# Patient Record
Sex: Female | Born: 1950 | Race: Black or African American | Hispanic: No | Marital: Married | Smoking: Former smoker
Health system: Southern US, Community
[De-identification: ages and names within clinical notes are randomized; demographics above are authoritative.]

## PROBLEM LIST (undated history)

## (undated) DIAGNOSIS — G4733 Obstructive sleep apnea (adult) (pediatric): Secondary | ICD-10-CM

## (undated) DIAGNOSIS — Z860100 Personal history of colon polyps, unspecified: Secondary | ICD-10-CM

## (undated) DIAGNOSIS — I714 Abdominal aortic aneurysm, without rupture, unspecified: Secondary | ICD-10-CM

## (undated) DIAGNOSIS — I10 Essential (primary) hypertension: Secondary | ICD-10-CM

## (undated) DIAGNOSIS — E785 Hyperlipidemia, unspecified: Secondary | ICD-10-CM

## (undated) DIAGNOSIS — R3129 Other microscopic hematuria: Secondary | ICD-10-CM

## (undated) DIAGNOSIS — K219 Gastro-esophageal reflux disease without esophagitis: Secondary | ICD-10-CM

## (undated) DIAGNOSIS — L732 Hidradenitis suppurativa: Secondary | ICD-10-CM

## (undated) HISTORY — DX: Personal history of colon polyps, unspecified: Z86.0100

## (undated) HISTORY — DX: Obstructive sleep apnea (adult) (pediatric): G47.33

## (undated) HISTORY — DX: Hyperlipidemia, unspecified: E78.5

## (undated) HISTORY — DX: Gastro-esophageal reflux disease without esophagitis: K21.9

## (undated) HISTORY — DX: Essential (primary) hypertension: I10

## (undated) HISTORY — DX: Hidradenitis suppurativa: L73.2

## (undated) HISTORY — DX: Personal history of colonic polyps: Z86.010

## (undated) HISTORY — DX: Other microscopic hematuria: R31.29

---

## 1975-04-10 HISTORY — PX: TUBAL LIGATION: SHX77

## 2000-05-22 ENCOUNTER — Other Ambulatory Visit: Admission: RE | Admit: 2000-05-22 | Discharge: 2000-05-22 | Payer: Self-pay | Admitting: Endocrinology

## 2000-07-03 ENCOUNTER — Encounter: Admission: RE | Admit: 2000-07-03 | Discharge: 2000-07-03 | Payer: Self-pay | Admitting: Endocrinology

## 2000-07-03 ENCOUNTER — Encounter: Payer: Self-pay | Admitting: Endocrinology

## 2000-07-10 ENCOUNTER — Encounter: Payer: Self-pay | Admitting: Endocrinology

## 2000-07-10 ENCOUNTER — Encounter: Admission: RE | Admit: 2000-07-10 | Discharge: 2000-07-10 | Payer: Self-pay | Admitting: Endocrinology

## 2000-07-31 ENCOUNTER — Ambulatory Visit (HOSPITAL_COMMUNITY): Admission: RE | Admit: 2000-07-31 | Discharge: 2000-07-31 | Payer: Self-pay | Admitting: *Deleted

## 2001-03-17 ENCOUNTER — Ambulatory Visit (HOSPITAL_BASED_OUTPATIENT_CLINIC_OR_DEPARTMENT_OTHER): Admission: RE | Admit: 2001-03-17 | Discharge: 2001-03-17 | Payer: Self-pay | Admitting: Specialist

## 2001-03-31 ENCOUNTER — Ambulatory Visit (HOSPITAL_BASED_OUTPATIENT_CLINIC_OR_DEPARTMENT_OTHER): Admission: RE | Admit: 2001-03-31 | Discharge: 2001-03-31 | Payer: Self-pay | Admitting: Specialist

## 2001-10-03 ENCOUNTER — Encounter: Admission: RE | Admit: 2001-10-03 | Discharge: 2001-10-03 | Payer: Self-pay | Admitting: Endocrinology

## 2001-10-03 ENCOUNTER — Encounter: Payer: Self-pay | Admitting: Endocrinology

## 2001-10-06 ENCOUNTER — Other Ambulatory Visit: Admission: RE | Admit: 2001-10-06 | Discharge: 2001-10-06 | Payer: Self-pay | Admitting: Endocrinology

## 2002-06-19 ENCOUNTER — Ambulatory Visit (HOSPITAL_COMMUNITY): Admission: RE | Admit: 2002-06-19 | Discharge: 2002-06-19 | Payer: Self-pay | Admitting: *Deleted

## 2002-09-01 ENCOUNTER — Encounter: Payer: Self-pay | Admitting: Endocrinology

## 2002-09-01 ENCOUNTER — Encounter: Admission: RE | Admit: 2002-09-01 | Discharge: 2002-09-01 | Payer: Self-pay | Admitting: Endocrinology

## 2002-10-05 ENCOUNTER — Encounter: Payer: Self-pay | Admitting: Endocrinology

## 2002-10-05 ENCOUNTER — Encounter: Admission: RE | Admit: 2002-10-05 | Discharge: 2002-10-05 | Payer: Self-pay | Admitting: Endocrinology

## 2002-10-09 ENCOUNTER — Other Ambulatory Visit: Admission: RE | Admit: 2002-10-09 | Discharge: 2002-10-09 | Payer: Self-pay | Admitting: Endocrinology

## 2003-12-01 ENCOUNTER — Encounter: Admission: RE | Admit: 2003-12-01 | Discharge: 2003-12-01 | Payer: Self-pay | Admitting: Family Medicine

## 2011-12-21 ENCOUNTER — Other Ambulatory Visit: Payer: Self-pay | Admitting: Family

## 2011-12-21 ENCOUNTER — Telehealth: Payer: Self-pay | Admitting: *Deleted

## 2011-12-21 ENCOUNTER — Encounter: Payer: Self-pay | Admitting: Family

## 2011-12-21 ENCOUNTER — Ambulatory Visit (INDEPENDENT_AMBULATORY_CARE_PROVIDER_SITE_OTHER): Payer: BC Managed Care – PPO | Admitting: Family

## 2011-12-21 VITALS — BP 116/82 | HR 82 | Temp 98.2°F | Resp 16 | Ht 64.25 in | Wt 198.0 lb

## 2011-12-21 DIAGNOSIS — I10 Essential (primary) hypertension: Secondary | ICD-10-CM | POA: Insufficient documentation

## 2011-12-21 DIAGNOSIS — K219 Gastro-esophageal reflux disease without esophagitis: Secondary | ICD-10-CM

## 2011-12-21 DIAGNOSIS — E785 Hyperlipidemia, unspecified: Secondary | ICD-10-CM

## 2011-12-21 DIAGNOSIS — Z86010 Personal history of colonic polyps: Secondary | ICD-10-CM

## 2011-12-21 DIAGNOSIS — Z1231 Encounter for screening mammogram for malignant neoplasm of breast: Secondary | ICD-10-CM

## 2011-12-21 DIAGNOSIS — Z23 Encounter for immunization: Secondary | ICD-10-CM

## 2011-12-21 DIAGNOSIS — I714 Abdominal aortic aneurysm, without rupture: Secondary | ICD-10-CM | POA: Insufficient documentation

## 2011-12-21 DIAGNOSIS — L732 Hidradenitis suppurativa: Secondary | ICD-10-CM

## 2011-12-21 HISTORY — DX: Hidradenitis suppurativa: L73.2

## 2011-12-21 LAB — BASIC METABOLIC PANEL
BUN: 8 mg/dL (ref 6–23)
Chloride: 103 mEq/L (ref 96–112)
Glucose, Bld: 90 mg/dL (ref 70–99)
Potassium: 4.5 mEq/L (ref 3.5–5.3)
Sodium: 137 mEq/L (ref 135–145)

## 2011-12-21 MED ORDER — SUCRALFATE 1 G PO TABS: 1.0000 g | ORAL_TABLET | Freq: Four times a day (QID) | ORAL | Status: DC

## 2011-12-21 MED ORDER — ATORVASTATIN CALCIUM 80 MG PO TABS: 80.0000 mg | ORAL_TABLET | Freq: Every day | ORAL | Status: AC

## 2011-12-21 MED ORDER — AMLODIPINE-VALSARTAN-HCTZ 5-160-12.5 MG PO TABS: 1.0000 | ORAL_TABLET | Freq: Every day | ORAL | Status: DC

## 2011-12-21 MED ORDER — DEXLANSOPRAZOLE 60 MG PO CPDR: 60.0000 mg | DELAYED_RELEASE_CAPSULE | Freq: Every day | ORAL | Status: DC

## 2011-12-21 NOTE — Telephone Encounter (Signed)
Received fax from [facility] for dexilant. Prior auth form received and forwarded to Provider for completion and signature.

## 2011-12-21 NOTE — Assessment & Plan Note (Signed)
Advised pt to apply warm compresses to the areas as they appear.  Schedule OV if areas become sore/swollen so that we can start abx.

## 2011-12-21 NOTE — Patient Instructions (Addendum)
Please complete your blood work prior to leaving.  Follow up at your convenience for a fasting physical. Welcome to [facility]!

## 2011-12-21 NOTE — Assessment & Plan Note (Signed)
Pt reports that she has been following regularly at [facility] for monitoring.  She reports that she has upcoming apt scheduled. She does not remember name of MD at [facility], will request PCP records from [facility].

## 2011-12-21 NOTE — Assessment & Plan Note (Signed)
Stable. Continue dexilant/carafate.

## 2011-12-21 NOTE — Assessment & Plan Note (Signed)
Reports last colo 1 year ago with Dr. Noe Gens.  Management per GI.  Records requested.

## 2011-12-21 NOTE — Assessment & Plan Note (Signed)
Well controlled on exforge. Continue same.  Obtain BMET.

## 2011-12-21 NOTE — Progress Notes (Signed)
Subjective:    Patient ID: Briana Hernandez, female    DOB: 07-12-1950, 61 y.o.   MRN: [id number]  HPI  Has been followed at [facility] for primary care.  New today to establish care.   Skin infections- reports that she has hx of "boils in my armpits" for years and reports that she has had a surgical excision for this.  Reports that she also gets these boils in the groin area.  No boils today.  HTN-on exforge hct. Requesting refills and a coupon.  Hyperlipidemia- on lipitor. She denies myalgias.  GERD- on carafate and dexilant. Reports that she does not take every day.  Does help when she takes it.  Colon Polyps- Dr. Noe Gens at [facility].  Reports last colo was 1 year ago.   AAA- follows at [facility]- she has follow up ultrasound scheduled.     Review of Systems  Constitutional: Negative for unexpected weight change.  HENT: Negative for hearing loss.   Eyes: Negative for visual disturbance.  Respiratory: Negative for cough and shortness of breath.   Cardiovascular: Negative for chest pain.  Gastrointestinal: Positive for constipation. Negative for diarrhea.       Tries to drink coffee to help her have BM.  She has also tried fiber pills.  Reports 5 BM's a week.  Musculoskeletal: Negative for myalgias and arthralgias.  Skin: Negative for rash.  Neurological: Negative for headaches.  Hematological: Does not bruise/bleed easily.  Psychiatric/Behavioral:       Denies depression/anxiety   Past Medical History  Diagnosis Date  . GERD (gastroesophageal reflux disease)   . Hyperlipidemia   . Hypertension   . History of colon polyps 04/10/11?    [facility]--Dr Noe Gens    History   Social History  . Marital Status: Married    Spouse Name: N/A    Number of Children: 4  . Years of Education: N/A   Occupational History  . Not on file.   Social History Main Topics  . Smoking status: Current Every Day Smoker -- 1.0 packs/day for 35 years    Types: Cigarettes    . Smokeless tobacco: Never Used  . Alcohol Use: 7.2 oz/week    12 Cans of beer per week  . Drug Use: Not on file  . Sexually Active: Not on file   Other Topics Concern  . 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 Tubal ligation 1977    Family History  Problem Relation Age of Onset  . Diabetes Son     type I  . Leukemia Maternal Aunt   . Heart disease Maternal Uncle   . Hypertension Maternal Uncle   . Diabetes Maternal Uncle   . Stroke Maternal Grandmother   . Cancer Cousin     lung?    No Known Allergies  Current Outpatient Prescriptions on File Prior to Visit  Medication Sig Dispense Refill  . Amlodipine-Valsartan-HCTZ (EXFORGE HCT) 5-160-12.5 MG TABS Take 1 tablet by mouth daily.  30 tablet  5  . atorvastatin (LIPITOR) 80 MG tablet Take 1 tablet (80 mg total) by mouth daily.  30 tablet  5  . dexlansoprazole (DEXILANT) 60 MG capsule Take 1 capsule (60 mg total) by mouth daily.  30 capsule  5  . sucralfate (CARAFATE) 1 G tablet Take 1 tablet (1 g  total) by mouth 4 (four) times daily.  120 tablet  5    BP 116/82  Pulse 82  Temp 98.2 F (36.8 C) (Oral)  Resp 16  Ht 5' 4.25" (1.632 m)  Wt 198 lb (89.812 kg)  BMI 33.72 kg/m2  SpO2 97%       Objective:   Physical Exam  Constitutional: She is oriented to person, place, and time. She appears well-developed and well-nourished. No distress.  HENT:  Head: Normocephalic and atraumatic.  Right Ear: Tympanic membrane and ear canal normal.  Left Ear: Tympanic membrane and ear canal normal.  Mouth/Throat: No posterior oropharyngeal edema or posterior oropharyngeal erythema.  Cardiovascular: Normal rate and regular rhythm.   No murmur heard. Pulmonary/Chest: Effort normal and breath sounds normal. No respiratory distress. She has no  wheezes. She has no rales. She exhibits no tenderness.  Abdominal: Soft. Bowel sounds are normal.  Musculoskeletal: She exhibits no edema.  Neurological: She is alert and oriented to person, place, and time.  Skin: Skin is warm and dry.  Psychiatric: She has a normal mood and affect. Her behavior is normal. Judgment and thought content normal.          Assessment & Plan:

## 2011-12-21 NOTE — Assessment & Plan Note (Signed)
Tolerating statin.  Plan check LFT next visit.  Consider adding baby aspirin at that time for cardiac protection.

## 2011-12-24 ENCOUNTER — Encounter: Payer: Self-pay | Admitting: Family

## 2011-12-24 ENCOUNTER — Telehealth: Payer: Self-pay | Admitting: Family

## 2011-12-24 NOTE — Telephone Encounter (Signed)
Called pt re: prior auth for dexilant. She tells me that she has tried nexium and omeprazole and they did not help her symptoms.  Told her that I would leave samples of dexilant (#20 tabs) at the front desk for her.

## 2011-12-24 NOTE — Telephone Encounter (Signed)
Signed.

## 2011-12-25 NOTE — Telephone Encounter (Signed)
PA form was faxed to [facility] on 12/24/11. Received authorization today for Dexilant from 12/03/11 through 12/23/12. [facility] and left detailed message on pt's home #.

## 2012-01-04 ENCOUNTER — Ambulatory Visit (HOSPITAL_BASED_OUTPATIENT_CLINIC_OR_DEPARTMENT_OTHER)
Admission: RE | Admit: 2012-01-04 | Discharge: 2012-01-04 | Disposition: A | Discharge status: Home / Self Care | Payer: BC Managed Care – PPO | Source: Ambulatory Visit | Attending: Family | Admitting: Family

## 2012-01-04 ENCOUNTER — Encounter: Payer: Self-pay | Admitting: Family

## 2012-01-04 ENCOUNTER — Other Ambulatory Visit (HOSPITAL_COMMUNITY)
Admission: RE | Admit: 2012-01-04 | Discharge: 2012-01-04 | Disposition: A | Discharge status: Home / Self Care | Payer: BC Managed Care – PPO | Source: Ambulatory Visit | Attending: Family | Admitting: Family

## 2012-01-04 ENCOUNTER — Ambulatory Visit (INDEPENDENT_AMBULATORY_CARE_PROVIDER_SITE_OTHER): Payer: BC Managed Care – PPO | Admitting: Family

## 2012-01-04 VITALS — BP 98/72 | HR 76 | Temp 99.5°F | Resp 16 | Ht 63.0 in | Wt 197.5 lb

## 2012-01-04 DIAGNOSIS — Z01419 Encounter for gynecological examination (general) (routine) without abnormal findings: Secondary | ICD-10-CM | POA: Insufficient documentation

## 2012-01-04 DIAGNOSIS — R413 Other amnesia: Secondary | ICD-10-CM

## 2012-01-04 DIAGNOSIS — I10 Essential (primary) hypertension: Secondary | ICD-10-CM

## 2012-01-04 DIAGNOSIS — Z23 Encounter for immunization: Secondary | ICD-10-CM

## 2012-01-04 DIAGNOSIS — Z1231 Encounter for screening mammogram for malignant neoplasm of breast: Secondary | ICD-10-CM

## 2012-01-04 DIAGNOSIS — R21 Rash and other nonspecific skin eruption: Secondary | ICD-10-CM

## 2012-01-04 DIAGNOSIS — Z0000 Encounter for general adult medical examination without abnormal findings: Secondary | ICD-10-CM

## 2012-01-04 DIAGNOSIS — I714 Abdominal aortic aneurysm, without rupture: Secondary | ICD-10-CM

## 2012-01-04 MED ORDER — BETAMETHASONE DIPROPIONATE 0.05 % EX CREA: TOPICAL_CREAM | Freq: Two times a day (BID) | CUTANEOUS | Status: DC

## 2012-01-04 NOTE — Patient Instructions (Addendum)
Please complete your blood work prior to leaving.  Call your insurance and see if they will cover shingles vaccine (zostavax). If so, let us know and we will check to see if you are immune to chicken pox first. Schedule a follow up appointment for memory testing (30 minute appointment).

## 2012-01-04 NOTE — Progress Notes (Signed)
Subjective:    Patient ID: AZURI YA, female    DOB: June 09, 1950, 61 y.o.   MRN: [id number]  HPI  Patient presents today for complete physical.  Immunizations: Flu shot today, last tetanus- unknown.   Diet: healthy Exercise: Colonoscopy: last year Dexa:? Pap Smear: Needs pap smear.   Mammogram: had this morning.  Tobacco abuse-    Rash left forearm- Sunday x 2 weeks.    She reports memory problems lately.  Sister has dementia.   Reports some problems sleeping.     Review of Systems  Constitutional: Negative for unexpected weight change.  HENT: Negative for hearing loss.   Eyes: Negative for visual disturbance.  Respiratory: Negative for cough.   Cardiovascular: Negative for leg swelling.  Gastrointestinal: Negative for constipation.       Reports "gassey."    Genitourinary: Positive for frequency. Negative for dysuria.  Musculoskeletal: Negative for myalgias.       L wrist pain.  Bothering her x 1 week, no injury  Skin: Positive for rash.  Neurological: Negative for headaches.  Hematological: Does not bruise/bleed easily.  Psychiatric/Behavioral:       Denies depression or anxiety   Past Medical History  Diagnosis Date  . GERD (gastroesophageal reflux disease)   . Hyperlipidemia   . Hypertension   . History of colon polyps 04/10/11?    [facility]--Dr Noe Gens  . Hidradenitis suppurativa 12/21/2011    History   Social History  . Marital Status: Married    Spouse Name: N/A    Number of Children: 4  . Years of Education: N/A   Occupational History  . Not on file.   Social History Main Topics  . Smoking status: Current Every Day Smoker -- 1.0 packs/day for 35 years    Types: Cigarettes  . Smokeless tobacco: Never Used  . Alcohol Use: 7.2 oz/week    12 Cans of beer per week  . Drug Use: Not on file  . Sexually Active: Not on file   Other Topics Concern  . 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 Tubal ligation 1977    Family History  Problem Relation Age of Onset  . Diabetes Son     type I  . Leukemia Maternal Aunt   . Heart disease Maternal Uncle   . Hypertension Maternal Uncle   . Diabetes Maternal Uncle   . Stroke Maternal Grandmother   . Cancer Cousin     lung?    No Known Allergies  Current Outpatient Prescriptions on File Prior to Visit  Medication Sig Dispense Refill  . Amlodipine-Valsartan-HCTZ (EXFORGE HCT) 5-160-12.5 MG TABS Take 1 tablet by mouth daily.  30 tablet  5  . atorvastatin (LIPITOR) 80 MG tablet Take 1 tablet (80 mg total) by mouth daily.  30 tablet  5  . dexlansoprazole (DEXILANT) 60 MG capsule Take 1 capsule (60 mg total) by mouth daily.  30 capsule  5  . sucralfate (CARAFATE) 1 G tablet Take 1 tablet (1 g total) by mouth 4 (four) times daily.  120 tablet  5    BP 98/72  Pulse 76  Temp 99.5 F (37.5 C) (Oral)  Resp 16  Ht 5\' 3"  (1.6 m)  Wt 197 lb 8 oz (89.585 kg)  BMI 34.99 kg/m2  SpO2 97%  Objective:   Physical Exam   Physical Exam  Constitutional: She is oriented to person, place, and time. She appears well-developed and well-nourished. No distress.  HENT:  Head: Normocephalic and atraumatic.  Right Ear: Tympanic membrane and ear canal normal.  Left Ear: Tympanic membrane and ear canal normal.  Mouth/Throat: Oropharynx is clear and moist.  Eyes: Pupils are equal, round, and reactive to light. No scleral icterus.  Neck: Normal range of motion. No thyromegaly present.  Cardiovascular: Normal rate and regular rhythm.   No murmur heard. Pulmonary/Chest: Effort normal and breath sounds normal. No respiratory distress. He has no wheezes. She has no rales. She exhibits no tenderness.  Abdominal: Soft. Bowel sounds are normal. He exhibits no distension and no mass. There is  no tenderness. There is no rebound and no guarding.  Musculoskeletal: She exhibits no edema.  Lymphadenopathy:    She has no cervical adenopathy.  Neurological: She is alert and oriented to person, place, and time.  She exhibits normal muscle tone. Coordination normal.  Skin: Skin is warm and dry. raised erythematous rash noted left forearm.  Psychiatric: She has a normal mood and affect. Her behavior is normal. Judgment and thought content normal.  Breasts: Examined lying Right: Without masses, retractions, discharge or axillary adenopathy.  Left: Without masses, retractions, discharge or axillary adenopathy.  Inguinal/mons: Normal without inguinal adenopathy  External genitalia: Normal  BUS/Urethra/Skene's glands: Normal  Bladder: Normal  Vagina: Normal  Cervix: Normal (Pap performed with chaperone) Uterus: normal in size, shape and contour. Midline and mobile  Adnexa/parametria:  Rt: Without masses or tenderness.  Lt: Without masses or tenderness.  Anus and perineum: Normal           Assessment & Plan:        Assessment & Plan:

## 2012-01-06 DIAGNOSIS — R21 Rash and other nonspecific skin eruption: Secondary | ICD-10-CM | POA: Insufficient documentation

## 2012-01-06 NOTE — Assessment & Plan Note (Addendum)
She is following at [facility] for monitoring- has an apt next week.  She will request that consultant forward office note to me.

## 2012-01-06 NOTE — Assessment & Plan Note (Addendum)
Patient was counseled on diet, exercise, weight loss, and quitting smoking.  Up to date on flu shot, Tdap today. Pap today. Mammogram today, colo up to date. Order dexa.

## 2012-01-06 NOTE — Assessment & Plan Note (Signed)
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 Recommended diprolene cream prn and benadryl PRN. She complains of some trouble sleeping, and I think a benadryl at bedtime will also help her to sleep.

## 2012-01-09 ENCOUNTER — Telehealth: Payer: Self-pay | Admitting: Family

## 2012-01-09 NOTE — Telephone Encounter (Signed)
Pls remind pt to complete fasting blood work ordered at her apt.

## 2012-01-10 ENCOUNTER — Encounter: Payer: Self-pay | Admitting: Family

## 2012-01-11 ENCOUNTER — Other Ambulatory Visit: Payer: BC Managed Care – PPO

## 2012-01-11 NOTE — Telephone Encounter (Signed)
Attempted to reach pt and left detailed message to return to the lab for bloodwork and to call if any questions.

## 2012-01-14 ENCOUNTER — Ambulatory Visit: Payer: BC Managed Care – PPO | Admitting: Family

## 2012-01-17 ENCOUNTER — Telehealth: Payer: Self-pay | Admitting: *Deleted

## 2012-01-17 NOTE — Telephone Encounter (Signed)
Pt left message requesting pap smear results. Left detailed message on home# and that letter has been mailed to her re: these results. Also reminded pt to return to the lab for her fasting blood work as soon as possible and to call if any questions.

## 2012-01-23 LAB — CBC WITH DIFFERENTIAL/PLATELET
Eosinophils Absolute: 0.2 10*3/uL (ref 0.0–0.7)
Eosinophils Relative: 2 % (ref 0–5)
HCT: 38.8 % (ref 36.0–46.0)
Lymphs Abs: 4.1 10*3/uL — ABNORMAL HIGH (ref 0.7–4.0)
MCH: 27.6 pg (ref 26.0–34.0)
MCV: 83.6 fL (ref 78.0–100.0)
Monocytes Absolute: 0.6 10*3/uL (ref 0.1–1.0)
Platelets: 392 10*3/uL (ref 150–400)
RBC: 4.64 MIL/uL (ref 3.87–5.11)
RDW: 14 % (ref 11.5–15.5)

## 2012-01-23 LAB — HEPATIC FUNCTION PANEL
ALT: 13 U/L (ref 0–35)
Albumin: 4.3 g/dL (ref 3.5–5.2)
Alkaline Phosphatase: 71 U/L (ref 39–117)
Indirect Bilirubin: 0.6 mg/dL (ref 0.0–0.9)
Total Protein: 6.8 g/dL (ref 6.0–8.3)

## 2012-01-23 LAB — LIPID PANEL
Cholesterol: 139 mg/dL (ref 0–200)
VLDL: 12 mg/dL (ref 0–40)

## 2012-01-23 LAB — VITAMIN B12: Vitamin B-12: 310 pg/mL (ref 211–911)

## 2012-01-23 LAB — TSH: TSH: 1.535 u[IU]/mL (ref 0.350–4.500)

## 2012-01-24 LAB — URINALYSIS, ROUTINE W REFLEX MICROSCOPIC
Glucose, UA: NEGATIVE mg/dL
Leukocytes, UA: NEGATIVE
Nitrite: NEGATIVE
Protein, ur: NEGATIVE mg/dL

## 2012-01-24 LAB — URINALYSIS, MICROSCOPIC ONLY
Bacteria, UA: NONE SEEN
Crystals: NONE SEEN

## 2012-01-25 ENCOUNTER — Telehealth: Payer: Self-pay | Admitting: Family

## 2012-01-25 NOTE — Telephone Encounter (Signed)
Left message on home # to return my call. 

## 2012-01-25 NOTE — Telephone Encounter (Signed)
Please call pt and let her know that I reviewed her lab work.  She has microscopic blood in her urine.  I would like for her to see a specialist (urology) to further evaluate. (pended below) Liver, thyroid, cholesterol, B12 and folate all normal.

## 2012-01-28 NOTE — Telephone Encounter (Signed)
Pt returned my call and left message to call her back. Attempted to reach pt and left message on home# to return my call.

## 2012-01-29 NOTE — Telephone Encounter (Signed)
Left detailed message on home # re: results and to return my call tomorrow.

## 2012-01-30 ENCOUNTER — Telehealth: Payer: Self-pay | Admitting: *Deleted

## 2012-01-30 NOTE — Telephone Encounter (Signed)
OK will hold off of urology referral pending evaluation of her records.

## 2012-01-30 NOTE — Telephone Encounter (Signed)
Spoke with pt re: urology referral and she reports that she saw Dr Sabino Gasser at [facility] and had an extensive workup in the past but doesn't think a cause was ever determined for her hematuria. Pt will come by the office tomorrow to sign a records release. Pt wants to hold off on urology referral until we receive previous records and pt doesn't want to return to [facility]. Please advise.

## 2012-01-30 NOTE — Telephone Encounter (Signed)
Pt called requesting a return call from [place].

## 2012-01-30 NOTE — Telephone Encounter (Signed)
Attempted to reach pt and was told she was at work. Mobile # listed [phone]) is not the correct # for this pt. Will try pt tomorrow.

## 2012-01-31 ENCOUNTER — Telehealth: Payer: Self-pay | Admitting: Family

## 2012-01-31 NOTE — Telephone Encounter (Signed)
Received medical records from [facility]  P: [phone] F: [phone]

## 2012-02-01 ENCOUNTER — Encounter: Payer: Self-pay | Admitting: Family

## 2012-02-01 NOTE — Telephone Encounter (Signed)
See phone note from 01/25/12.

## 2012-02-01 NOTE — Telephone Encounter (Signed)
Reviewed.  No further work up necessary.

## 2012-02-01 NOTE — Telephone Encounter (Signed)
Records received from [facility] and forwarded to Provider for review.  Please advise.

## 2012-02-01 NOTE — Telephone Encounter (Signed)
Attempted to reach pt and was told she just left for work.  Will try to reach pt on Monday.

## 2012-02-07 NOTE — Telephone Encounter (Signed)
Left detailed message on home# and to call if any questions. 

## 2012-03-19 ENCOUNTER — Ambulatory Visit (INDEPENDENT_AMBULATORY_CARE_PROVIDER_SITE_OTHER): Payer: BC Managed Care – PPO | Admitting: Family

## 2012-03-19 ENCOUNTER — Encounter: Payer: Self-pay | Admitting: Family

## 2012-03-19 VITALS — BP 132/82 | HR 80 | Temp 97.8°F | Resp 16 | Wt 209.1 lb

## 2012-03-19 DIAGNOSIS — R309 Painful micturition, unspecified: Secondary | ICD-10-CM

## 2012-03-19 DIAGNOSIS — J40 Bronchitis, not specified as acute or chronic: Secondary | ICD-10-CM | POA: Insufficient documentation

## 2012-03-19 DIAGNOSIS — N23 Unspecified renal colic: Secondary | ICD-10-CM

## 2012-03-19 DIAGNOSIS — R350 Frequency of micturition: Secondary | ICD-10-CM

## 2012-03-19 DIAGNOSIS — R319 Hematuria, unspecified: Secondary | ICD-10-CM

## 2012-03-19 LAB — POCT URINALYSIS DIPSTICK
Glucose, UA: NEGATIVE
Protein, UA: NEGATIVE
Spec Grav, UA: <=1.005
Urobilinogen, UA: 0.2

## 2012-03-19 MED ORDER — CEFUROXIME AXETIL 500 MG PO TABS: 500.0000 mg | ORAL_TABLET | Freq: Two times a day (BID) | ORAL | Status: DC

## 2012-03-19 MED ORDER — BENZONATATE 100 MG PO CAPS: 100.0000 mg | ORAL_CAPSULE | Freq: Three times a day (TID) | ORAL | Status: AC | PRN

## 2012-03-19 NOTE — Progress Notes (Signed)
Subjective:    Patient ID: Briana Hernandez, female    DOB: 04-25-1950, 61 y.o.   MRN: [id number]  HPI  1) Cough- reports cough has had productive cough x 2 weeks. She denies fever.  Some associated nasal congestion.  2) Urinary frequency- reports that she has had frequency and urgency.  Some stress incontinence with cough.    Review of Systems    see HPI  Past Medical History  Diagnosis Date  . GERD (gastroesophageal reflux disease)   . Hyperlipidemia   . Hypertension   . History of colon polyps 04/10/11?    [facility]--Dr Noe Gens  . Hidradenitis suppurativa 12/21/2011  . Microscopic hematuria     had neg cystoscopy  1/08 Dr Sabino Gasser    History   Social History  . Marital Status: Married    Spouse Name: N/A    Number of Children: 4  . Years of Education: N/A   Occupational History  . Not on file.   Social History Main Topics  . Smoking status: Former Smoker -- 1.0 packs/day for 35 years    Types: Cigarettes    Quit date: 01/29/2012  . Smokeless tobacco: Never Used  . Alcohol Use: 7.2 oz/week    12 Cans of beer per week  . Drug Use: Not on file  . Sexually Active: Not on file   Other Topics Concern  . 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 Tubal ligation 1977    Family History  Problem Relation Age of Onset  . Diabetes Son     type I  . Leukemia Maternal Aunt   . Heart disease Maternal Uncle   . Hypertension Maternal Uncle   . Diabetes Maternal Uncle   . Stroke Maternal Grandmother   . Cancer Cousin     lung?    No Known Allergies  Current Outpatient Prescriptions on File Prior to Visit  Medication Sig Dispense Refill  . Amlodipine-Valsartan-HCTZ (EXFORGE HCT) 5-160-12.5 MG TABS Take 1 tablet by mouth daily.  30 tablet  5  . atorvastatin (LIPITOR)  80 MG tablet Take 1 tablet (80 mg total) by mouth daily.  30 tablet  5  . betamethasone dipropionate (DIPROLENE) 0.05 % cream Apply topically 2 (two) times daily.  30 g  0  . dexlansoprazole (DEXILANT) 60 MG capsule Take 1 capsule (60 mg total) by mouth daily.  30 capsule  5  . sucralfate (CARAFATE) 1 G tablet Take 1 tablet (1 g total) by mouth 4 (four) times daily.  120 tablet  5    BP 132/82  Pulse 80  Temp 97.8 F (36.6 C) (Oral)  Resp 16  Wt 209 lb 1.3 oz (94.838 kg)  SpO2 98%    Objective:   Physical Exam  Constitutional: She is oriented to person, place, and time. She appears well-developed and well-nourished. No distress.  HENT:  Head: Normocephalic and atraumatic.  Cardiovascular: Normal rate and regular rhythm.   No murmur heard. Pulmonary/Chest: Effort normal and breath sounds normal. No respiratory distress. She has no wheezes. She has no rales. She exhibits no tenderness.  Musculoskeletal: She exhibits no edema.  Neurological: She is alert and oriented to person, place, and time.  Skin: Skin is warm and dry.  Psychiatric: She has a normal mood and affect. Her behavior is normal.  Judgment and thought content normal.          Assessment & Plan:

## 2012-03-19 NOTE — Addendum Note (Signed)
Addended by: Candie Echevaria L on: 03/19/2012 04:44 PM   Modules accepted: Orders

## 2012-03-19 NOTE — Patient Instructions (Addendum)
Please call if symptoms worsen, or if you are not feeling better in 2-3 days.

## 2012-03-19 NOTE — Addendum Note (Signed)
Addended by: Candie Echevaria L on: 03/19/2012 04:40 PM   Modules accepted: Orders

## 2012-03-19 NOTE — Assessment & Plan Note (Addendum)
UA notes + blood.  (known hx of microscopic hematuria with neg work up in 2008 under Dr. Sabino Gasser) Will send for culture. Ceftin should cover urinary pathogens if culture +.

## 2012-03-19 NOTE — Assessment & Plan Note (Signed)
61 yr old female with cough x 2 weeks. Rx with ceftin and add tessalon for cough.

## 2012-03-21 LAB — URINE CULTURE

## 2012-05-21 ENCOUNTER — Encounter: Payer: Self-pay | Admitting: Family

## 2012-05-21 ENCOUNTER — Ambulatory Visit: Payer: BC Managed Care – PPO | Admitting: Family

## 2012-05-21 ENCOUNTER — Ambulatory Visit (INDEPENDENT_AMBULATORY_CARE_PROVIDER_SITE_OTHER): Payer: BC Managed Care – PPO | Admitting: Family

## 2012-05-21 VITALS — BP 126/84 | HR 83 | Temp 98.5°F | Resp 16 | Wt 221.1 lb

## 2012-05-21 DIAGNOSIS — R3129 Other microscopic hematuria: Secondary | ICD-10-CM

## 2012-05-21 DIAGNOSIS — M1990 Unspecified osteoarthritis, unspecified site: Secondary | ICD-10-CM | POA: Insufficient documentation

## 2012-05-21 DIAGNOSIS — G4733 Obstructive sleep apnea (adult) (pediatric): Secondary | ICD-10-CM

## 2012-05-21 DIAGNOSIS — R32 Unspecified urinary incontinence: Secondary | ICD-10-CM

## 2012-05-21 DIAGNOSIS — R0602 Shortness of breath: Secondary | ICD-10-CM | POA: Insufficient documentation

## 2012-05-21 DIAGNOSIS — I714 Abdominal aortic aneurysm, without rupture: Secondary | ICD-10-CM

## 2012-05-21 HISTORY — DX: Obstructive sleep apnea (adult) (pediatric): G47.33

## 2012-05-21 LAB — URINALYSIS, ROUTINE W REFLEX MICROSCOPIC
Bilirubin Urine: NEGATIVE
Ketones, ur: NEGATIVE mg/dL
Specific Gravity, Urine: 1.016 (ref 1.005–1.030)
Urobilinogen, UA: 1 mg/dL (ref 0.0–1.0)
pH: 7.5 (ref 5.0–8.0)

## 2012-05-21 LAB — POCT URINALYSIS DIPSTICK
Ketones, UA: NEGATIVE
Leukocytes, UA: NEGATIVE
Nitrite, UA: NEGATIVE
Protein, UA: NEGATIVE
Urobilinogen, UA: 0.2

## 2012-05-21 LAB — CBC WITH DIFFERENTIAL/PLATELET
Basophils Relative: 1 % (ref 0–1)
Eosinophils Absolute: 0.3 10*3/uL (ref 0.0–0.7)
Eosinophils Relative: 4 % (ref 0–5)
HCT: 37.3 % (ref 36.0–46.0)
Hemoglobin: 12.4 g/dL (ref 12.0–15.0)
Lymphs Abs: 3.3 10*3/uL (ref 0.7–4.0)
MCH: 26.3 pg (ref 26.0–34.0)
MCHC: 33.2 g/dL (ref 30.0–36.0)
MCV: 79 fL (ref 78.0–100.0)
Monocytes Absolute: 0.6 10*3/uL (ref 0.1–1.0)
Monocytes Relative: 9 % (ref 3–12)
Neutrophils Relative %: 41 % — ABNORMAL LOW (ref 43–77)
RBC: 4.72 MIL/uL (ref 3.87–5.11)

## 2012-05-21 MED ORDER — FUROSEMIDE 20 MG PO TABS: 20.0000 mg | ORAL_TABLET | Freq: Every day | ORAL | Status: DC

## 2012-05-21 MED ORDER — TOLTERODINE TARTRATE 1 MG PO TABS: 1.0000 mg | ORAL_TABLET | Freq: Two times a day (BID) | ORAL | Status: DC

## 2012-05-21 NOTE — Assessment & Plan Note (Addendum)
Reviewed EKG- looks ok. Will obtain 2d echo to further evaluate.  Check BNP, add furosemide 20mg  once daily.

## 2012-05-21 NOTE — Assessment & Plan Note (Signed)
62 yr old female with urinary incontinence- obtain UA, trial of Detrol.

## 2012-05-21 NOTE — Patient Instructions (Addendum)
Please complete your blood work prior to leaving.  You may use tylenol as needed for arthritis pain.  You will be contacted about your referral for stress test and echocardiogram.  Please let us know if you have not heard back within 1 week about your referral. Follow up in 1 week.

## 2012-05-21 NOTE — Progress Notes (Signed)
Subjective:    Patient ID: Briana Hernandez, female    DOB: Jan 10, 1951, 63 y.o.   MRN: [id number]  HPI  Briana Hernandez is a 62 yr old female who presents today with several concerns.  1) Edema/sob- she reports some scratchy throat.  She reports that she has DOE, especially when she tries to bend over to tie her shoe.  She denies cp.  She reports + swelling in her feet and legs.  She reports that she she sleeps propped up due to GERD.  She also admits to a >10 yr hx of OSA which she has not mentioned before. She has been on CPAP and has an old machine at home. She has not been using machine because she finds it drying and uncomfortable.   2) Insomnia- can fall asleep but having trouble staying asleep. She reports that this is a new problem for her.  She has taken some of her husband's Remus Loffler which helped some, but she still woke up.  She reports one cup of coffee in the AM, occasional soda in the afternoon.    3) Arm pain- Reports some pain in the left shoulder.  Also has some pain in the left wrist which is worse with lifting.    4) urinary incontinence- Reports itching on labia.     Review of Systems  See HPI  Past Medical History  Diagnosis Date  . GERD (gastroesophageal reflux disease)   . Hyperlipidemia   . Hypertension   . History of colon polyps 04/10/11?    [facility]--Dr Noe Gens  . Hidradenitis suppurativa 12/21/2011  . Microscopic hematuria     had neg cystoscopy  1/08 Dr Sabino Gasser    History   Social History  . Marital Status: Married    Spouse Name: N/A    Number of Children: 4  . Years of Education: N/A   Occupational History  . Not on file.   Social History Main Topics  . Smoking status: Former Smoker -- 1.00 packs/day for 35 years    Types: Cigarettes    Quit date: 01/29/2012  . Smokeless tobacco: Never Used  . Alcohol Use: 7.2 oz/week    12 Cans of beer per week  . Drug Use: Not on file  . Sexually Active: Not on file   Other Topics Concern  .  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   Married   Enjoys crosswords/television    Past Surgical History  Procedure Laterality Date  . Tubal ligation  1977    Family History  Problem Relation Age of Onset  . Diabetes Son     type I  . Leukemia Maternal Aunt   . Heart disease Maternal Uncle   . Hypertension Maternal Uncle   . Diabetes Maternal Uncle   . Stroke Maternal Grandmother   . Cancer Cousin     lung?    No Known Allergies  Current Outpatient Prescriptions on File Prior to Visit  Medication Sig Dispense Refill  . Amlodipine-Valsartan-HCTZ (EXFORGE HCT) 5-160-12.5 MG TABS Take 1 tablet by mouth daily.  30 tablet  5  . atorvastatin (LIPITOR) 80 MG tablet Take 1 tablet (80 mg total) by mouth daily.  30 tablet  5  . betamethasone dipropionate (DIPROLENE) 0.05 % cream Apply topically 2 (two) times daily.  30 g  0  .  dexlansoprazole (DEXILANT) 60 MG capsule Take 1 capsule (60 mg total) by mouth daily.  30 capsule  5  . sucralfate (CARAFATE) 1 G tablet Take 1 tablet (1 g total) by mouth 4 (four) times daily.  120 tablet  5   No current facility-administered medications on file prior to visit.    BP 126/84  Pulse 83  Temp(Src) 98.5 F (36.9 C) (Oral)  Resp 16  Wt 221 lb 1.3 oz (100.281 kg)  BMI 39.17 kg/m2  SpO2 98%       Objective:   Physical Exam  Constitutional: She is oriented to person, place, and time. She appears well-developed and well-nourished. No distress.  Cardiovascular: Normal rate and regular rhythm.   No murmur heard. Pulmonary/Chest: Effort normal and breath sounds normal. No respiratory distress. She has no wheezes. She has no rales. She exhibits no tenderness.  Musculoskeletal: She exhibits no edema.  2+ LE edema  Neurological: She is alert and oriented to person, place, and time.  Skin: Skin is warm and dry.   Psychiatric: She has a normal mood and affect. Her behavior is normal. Judgment and thought content normal.          Assessment & Plan:

## 2012-05-21 NOTE — Assessment & Plan Note (Signed)
Recommended tylenol prn.

## 2012-05-21 NOTE — Assessment & Plan Note (Signed)
I do not have record of recent abdominal imaging. Consider ultrasound next visit.

## 2012-05-21 NOTE — Assessment & Plan Note (Signed)
Reinforced importance of compliance with CPAP.  We can try to order her a new machine, however, I will need to get a copy of her last sleep study which she tells me was 2 yrs ago.  She signed medical release today.

## 2012-05-22 LAB — URINALYSIS, MICROSCOPIC ONLY
Casts: NONE SEEN
Crystals: NONE SEEN
Squamous Epithelial / LPF: NONE SEEN

## 2012-05-23 LAB — URINE CULTURE: Colony Count: NO GROWTH

## 2012-05-27 ENCOUNTER — Ambulatory Visit (INDEPENDENT_AMBULATORY_CARE_PROVIDER_SITE_OTHER): Payer: BC Managed Care – PPO | Admitting: Family

## 2012-05-27 ENCOUNTER — Encounter: Payer: Self-pay | Admitting: Family

## 2012-05-27 VITALS — BP 110/72 | HR 82 | Temp 97.8°F | Resp 16 | Ht 63.0 in | Wt 222.0 lb

## 2012-05-27 DIAGNOSIS — R0602 Shortness of breath: Secondary | ICD-10-CM

## 2012-05-27 NOTE — Patient Instructions (Addendum)
Please follow up in 2 months, sooner if symptoms worsen or do not improve.

## 2012-05-27 NOTE — Progress Notes (Signed)
Subjective:    Patient ID: Briana Hernandez, female    DOB: March 19, 1951, 62 y.o.   MRN: [id number]  HPI  Briana Hernandez is a 62 yr old female who presents today for follow up of SOB.  She reports that she continues to have shortness of breath.  BNP was normal last visit. She reports that stress test and echo have not yet been scheduled.     Review of Systems See HPI  Past Medical History  Diagnosis Date  . GERD (gastroesophageal reflux disease)   . Hyperlipidemia   . Hypertension   . History of colon polyps 04/10/11?    [facility]--Dr Noe Gens  . Hidradenitis suppurativa 12/21/2011  . Microscopic hematuria     had neg cystoscopy  1/08 Dr Sabino Gasser  . OSA (obstructive sleep apnea) 05/21/2012    History   Social History  . Marital Status: Married    Spouse Name: N/A    Number of Children: 4  . Years of Education: N/A   Occupational History  . Not on file.   Social History Main Topics  . Smoking status: Former Smoker -- 1.00 packs/day for 35 years    Types: Cigarettes    Quit date: 01/29/2012  . Smokeless tobacco: Never Used  . Alcohol Use: 7.2 oz/week    12 Cans of beer per week  . Drug Use: Not on file  . Sexually Active: Not on file   Other Topics Concern  . 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   Married   Enjoys crosswords/television    Past Surgical History  Procedure Laterality Date  . Tubal ligation  1977    Family History  Problem Relation Age of Onset  . Diabetes Son     type I  . Leukemia Maternal Aunt   . Heart disease Maternal Uncle   . Hypertension Maternal Uncle   . Diabetes Maternal Uncle   . Stroke Maternal Grandmother   . Cancer Cousin     lung?    No Known Allergies  Current Outpatient Prescriptions on File Prior to Visit  Medication Sig Dispense Refill  . Amlodipine-Valsartan-HCTZ (EXFORGE HCT)  5-160-12.5 MG TABS Take 1 tablet by mouth daily.  30 tablet  5  . atorvastatin (LIPITOR) 80 MG tablet Take 1 tablet (80 mg total) by mouth daily.  30 tablet  5  . dexlansoprazole (DEXILANT) 60 MG capsule Take 1 capsule (60 mg total) by mouth daily.  30 capsule  5  . furosemide (LASIX) 20 MG tablet Take 1 tablet (20 mg total) by mouth daily.  30 tablet  3  . sucralfate (CARAFATE) 1 G tablet Take 1 tablet (1 g total) by mouth 4 (four) times daily.  120 tablet  5  . tolterodine (DETROL) 1 MG tablet Take 1 tablet (1 mg total) by mouth 2 (two) times daily.  30 tablet  2   No current facility-administered medications on file prior to visit.    BP 110/72  Pulse 82  Temp(Src) 97.8 F (36.6 C) (Oral)  Resp 16  Ht 5\' 3"  (1.6 m)  Wt 222 lb (100.699 kg)  BMI 39.34 kg/m2  SpO2 96%       Objective:   Physical Exam  Constitutional: She is oriented to person, place, and time. She appears well-developed and well-nourished. No distress.  HENT:  Head: Normocephalic and atraumatic.  Cardiovascular: Normal rate and regular rhythm.   No murmur heard. Pulmonary/Chest: Effort normal and breath sounds normal. No respiratory distress. She has no wheezes. She has no rales. She exhibits no tenderness.  Musculoskeletal: She exhibits no edema.  Neurological: She is alert and oriented to person, place, and time.  Skin: Skin is warm and dry.  Psychiatric: She has a normal mood and affect. Her behavior is normal. Judgment and thought content normal.          Assessment & Plan:

## 2012-05-27 NOTE — Assessment & Plan Note (Signed)
Await 2D echo, stress test.  Refax request for sleep study from [facility].  We can reorder CPAP supplies once we have this info. I suspect that her non-compliance with cpap is primary factor in her sob along with her obesity.

## 2012-05-28 ENCOUNTER — Ambulatory Visit: Payer: BC Managed Care – PPO | Admitting: Family

## 2012-05-29 ENCOUNTER — Telehealth: Payer: Self-pay | Admitting: Family

## 2012-05-29 NOTE — Telephone Encounter (Signed)
Received medical records from [facility]   P: [phone] F: [phone]

## 2012-06-03 ENCOUNTER — Ambulatory Visit (HOSPITAL_COMMUNITY): Payer: BC Managed Care – PPO | Attending: Cardiology | Admitting: Radiology

## 2012-06-03 VITALS — BP 135/69 | HR 65 | Ht 63.0 in | Wt 220.0 lb

## 2012-06-03 DIAGNOSIS — M542 Cervicalgia: Secondary | ICD-10-CM | POA: Insufficient documentation

## 2012-06-03 DIAGNOSIS — E785 Hyperlipidemia, unspecified: Secondary | ICD-10-CM | POA: Insufficient documentation

## 2012-06-03 DIAGNOSIS — I10 Essential (primary) hypertension: Secondary | ICD-10-CM | POA: Insufficient documentation

## 2012-06-03 DIAGNOSIS — R0609 Other forms of dyspnea: Secondary | ICD-10-CM | POA: Insufficient documentation

## 2012-06-03 DIAGNOSIS — Z87891 Personal history of nicotine dependence: Secondary | ICD-10-CM | POA: Insufficient documentation

## 2012-06-03 DIAGNOSIS — R0602 Shortness of breath: Secondary | ICD-10-CM | POA: Insufficient documentation

## 2012-06-03 DIAGNOSIS — E669 Obesity, unspecified: Secondary | ICD-10-CM | POA: Insufficient documentation

## 2012-06-03 DIAGNOSIS — R51 Headache: Secondary | ICD-10-CM | POA: Insufficient documentation

## 2012-06-03 DIAGNOSIS — R5381 Other malaise: Secondary | ICD-10-CM | POA: Insufficient documentation

## 2012-06-03 DIAGNOSIS — R0989 Other specified symptoms and signs involving the circulatory and respiratory systems: Secondary | ICD-10-CM | POA: Insufficient documentation

## 2012-06-03 MED ORDER — TECHNETIUM TC 99M SESTAMIBI GENERIC - CARDIOLITE
11.0000 | Freq: Once | INTRAVENOUS | Status: AC | PRN
  Administered 2012-06-03: 11 via INTRAVENOUS

## 2012-06-03 MED ORDER — TECHNETIUM TC 99M SESTAMIBI GENERIC - CARDIOLITE
33.0000 | Freq: Once | INTRAVENOUS | Status: AC | PRN
  Administered 2012-06-03: 33 via INTRAVENOUS

## 2012-06-03 MED ORDER — REGADENOSON 0.4 MG/5ML IV SOLN
0.4000 mg | Freq: Once | INTRAVENOUS | Status: AC
  Administered 2012-06-03: 0.4 mg via INTRAVENOUS

## 2012-06-03 NOTE — Progress Notes (Signed)
  [facility] SITE 3 NUCLEAR MED [address] [place], [place] [address] [phone]    Cardiology Nuclear Med Study  Briana Hernandez is a 62 y.o. female     MRN : [id number]     DOB: January 01, 1951  Procedure Date: 06/03/2012  Nuclear Med Background Indication for Stress Test:  Evaluation for Ischemia History:  ~39yrs ago [id number] per patient Cardiac Risk Factors: History of Smoking, Hypertension, Lipids and Obesity  Symptoms:  DOE, Fatigue and SOB   Nuclear Pre-Procedure Caffeine/Decaff Intake:  None NPO After: 9:00pm   Lungs:  Clear. O2 Sat: 95% on room air. IV 0.9% NS with Angio Cath:  22g  IV Site: R Hand  IV Started by:  Cathlyn Parsons, RN  Chest Size (in):  40 Cup Size: C  Height: 5\' 3"  (1.6 m)  Weight:  220 lb (99.791 kg)  BMI:  Body mass index is 38.98 kg/(m^2). Tech Comments:  n/a    Nuclear Med Study 1 or 2 day study: 1 day  Stress Test Type:  Treadmill/Lexiscan  Reading MD: Olga Millers, MD  Order Authorizing Provider:  Sandford Craze, NP and Danise Edge, MD  Resting Radionuclide: Technetium 40m Sestamibi  Resting Radionuclide Dose: 10.7 mCi   Stress Radionuclide:  Technetium 64m Sestamibi  Stress Radionuclide Dose: 33.0 mCi           Stress Protocol Rest HR: 65 Stress HR: 125  Rest BP: 135/69 Stress BP: 123/99  Exercise Time (min): 2:00 METS: n/a   Predicted Max HR: 159 bpm % Max HR: 78.62 bpm Rate Pressure Product: 44034   Dose of Adenosine (mg):  n/a Dose of Lexiscan: 0.4 mg  Dose of Atropine (mg): n/a Dose of Dobutamine: n/a mcg/kg/min (at max HR)  Stress Test Technologist: Smiley Houseman, CMA-N  Nuclear Technologist:  Domenic Polite, CNMT     Rest Procedure:  Myocardial perfusion imaging was performed at rest 45 minutes following the intravenous administration of Technetium 36m Sestamibi.  Rest ECG: NSR - Normal EKG  Stress Procedure:  The patient received IV Lexiscan 0.4 mg over 15-seconds with concurrent low level  exercise and then Technetium 39m Sestamibi was injected at 30-seconds while the patient continued walking one more minute.  She c/o neck tightness, dyspnea and a headache from the Lexiscan.  Quantitative spect images were obtained after a 45-minute delay.  Stress ECG: No significant ST segment change suggestive of ischemia.  QPS Raw Data Images:  Acquisition technically good; normal left ventricular size. Stress Images:  Normal homogeneous uptake in all areas of the myocardium. Rest Images:  Normal homogeneous uptake in all areas of the myocardium. Subtraction (SDS):  No evidence of ischemia. Transient Ischemic Dilatation (Normal <1.22):  1.10 Lung/Heart Ratio (Normal <0.45):  0.41  Quantitative Gated Spect Images QGS EDV:  101 ml QGS ESV:  35 ml  Impression Exercise Capacity:  Lexiscan with low level exercise. BP Response:  Normal blood pressure response. Clinical Symptoms:  There is dyspnea. ECG Impression:  No significant ST segment change suggestive of ischemia. Comparison with Prior Nuclear Study: No previous nuclear study performed  Overall Impression:  Normal stress nuclear study.  LV Ejection Fraction: 66%.  LV Wall Motion:  NL LV Function; NL Wall Motion   Olga Millers

## 2012-06-06 ENCOUNTER — Ambulatory Visit (HOSPITAL_COMMUNITY): Payer: BC Managed Care – PPO | Attending: Internal Medicine

## 2012-06-06 DIAGNOSIS — R0602 Shortness of breath: Secondary | ICD-10-CM

## 2012-06-06 NOTE — Progress Notes (Signed)
Echocardiogram performed.  

## 2012-06-08 ENCOUNTER — Encounter: Payer: Self-pay | Admitting: Family

## 2012-06-09 ENCOUNTER — Encounter: Payer: Self-pay | Admitting: Family

## 2012-06-17 ENCOUNTER — Other Ambulatory Visit: Payer: Self-pay | Admitting: Family

## 2012-06-17 NOTE — Telephone Encounter (Signed)
Exforge refill sent to pharmacy. Pt was last seen 05/27/12 and recommended follow up in April.  Appt has not been scheduled yet, please call pt to arrange follow up.

## 2012-06-17 NOTE — Telephone Encounter (Signed)
Left detailed message stating medication refill and that patient needs to call our office to schedule an appointment.

## 2012-07-07 ENCOUNTER — Ambulatory Visit: Payer: BC Managed Care – PPO | Admitting: Family

## 2012-07-22 ENCOUNTER — Ambulatory Visit: Payer: BC Managed Care – PPO | Admitting: Family

## 2012-11-15 ENCOUNTER — Emergency Department (HOSPITAL_COMMUNITY): Payer: BC Managed Care – PPO

## 2012-11-15 ENCOUNTER — Encounter (HOSPITAL_BASED_OUTPATIENT_CLINIC_OR_DEPARTMENT_OTHER): Payer: Self-pay | Admitting: Student

## 2012-11-15 ENCOUNTER — Emergency Department (HOSPITAL_COMMUNITY)
Admission: EM | Admit: 2012-11-15 | Discharge: 2012-11-16 | Disposition: A | Discharge status: Home / Self Care | Payer: BC Managed Care – PPO | Attending: Emergency Medicine | Admitting: Emergency Medicine

## 2012-11-15 ENCOUNTER — Encounter (HOSPITAL_COMMUNITY): Payer: Self-pay | Admitting: *Deleted

## 2012-11-15 ENCOUNTER — Emergency Department (HOSPITAL_BASED_OUTPATIENT_CLINIC_OR_DEPARTMENT_OTHER)
Admission: EM | Admit: 2012-11-15 | Discharge: 2012-11-15 | Discharge status: Against Medical Advice | Payer: BC Managed Care – PPO | Attending: Emergency Medicine | Admitting: Emergency Medicine

## 2012-11-15 DIAGNOSIS — Z87891 Personal history of nicotine dependence: Secondary | ICD-10-CM | POA: Insufficient documentation

## 2012-11-15 DIAGNOSIS — R5383 Other fatigue: Secondary | ICD-10-CM | POA: Insufficient documentation

## 2012-11-15 DIAGNOSIS — I10 Essential (primary) hypertension: Secondary | ICD-10-CM | POA: Insufficient documentation

## 2012-11-15 DIAGNOSIS — Z8669 Personal history of other diseases of the nervous system and sense organs: Secondary | ICD-10-CM | POA: Insufficient documentation

## 2012-11-15 DIAGNOSIS — I714 Abdominal aortic aneurysm, without rupture, unspecified: Secondary | ICD-10-CM | POA: Insufficient documentation

## 2012-11-15 DIAGNOSIS — Z872 Personal history of diseases of the skin and subcutaneous tissue: Secondary | ICD-10-CM | POA: Insufficient documentation

## 2012-11-15 DIAGNOSIS — Z8719 Personal history of other diseases of the digestive system: Secondary | ICD-10-CM | POA: Insufficient documentation

## 2012-11-15 DIAGNOSIS — R350 Frequency of micturition: Secondary | ICD-10-CM | POA: Insufficient documentation

## 2012-11-15 DIAGNOSIS — R109 Unspecified abdominal pain: Secondary | ICD-10-CM

## 2012-11-15 DIAGNOSIS — Z860100 Personal history of colon polyps, unspecified: Secondary | ICD-10-CM | POA: Insufficient documentation

## 2012-11-15 DIAGNOSIS — Z87448 Personal history of other diseases of urinary system: Secondary | ICD-10-CM | POA: Insufficient documentation

## 2012-11-15 DIAGNOSIS — R5381 Other malaise: Secondary | ICD-10-CM | POA: Insufficient documentation

## 2012-11-15 DIAGNOSIS — R0602 Shortness of breath: Secondary | ICD-10-CM | POA: Insufficient documentation

## 2012-11-15 DIAGNOSIS — M549 Dorsalgia, unspecified: Secondary | ICD-10-CM | POA: Insufficient documentation

## 2012-11-15 DIAGNOSIS — E785 Hyperlipidemia, unspecified: Secondary | ICD-10-CM | POA: Insufficient documentation

## 2012-11-15 DIAGNOSIS — R110 Nausea: Secondary | ICD-10-CM | POA: Insufficient documentation

## 2012-11-15 DIAGNOSIS — Z79899 Other long term (current) drug therapy: Secondary | ICD-10-CM | POA: Insufficient documentation

## 2012-11-15 DIAGNOSIS — R911 Solitary pulmonary nodule: Secondary | ICD-10-CM | POA: Insufficient documentation

## 2012-11-15 LAB — CBC WITH DIFFERENTIAL/PLATELET
Basophils Relative: 0 % (ref 0–1)
Eosinophils Absolute: 0.1 10*3/uL (ref 0.0–0.7)
HCT: 37.5 % (ref 36.0–46.0)
Hemoglobin: 13 g/dL (ref 12.0–15.0)
Lymphs Abs: 4.4 10*3/uL — ABNORMAL HIGH (ref 0.7–4.0)
MCH: 27.7 pg (ref 26.0–34.0)
MCHC: 34.7 g/dL (ref 30.0–36.0)
MCV: 79.8 fL (ref 78.0–100.0)
Monocytes Absolute: 0.6 10*3/uL (ref 0.1–1.0)
Monocytes Relative: 6 % (ref 3–12)
RBC: 4.7 MIL/uL (ref 3.87–5.11)

## 2012-11-15 LAB — URINALYSIS, ROUTINE W REFLEX MICROSCOPIC
Bilirubin Urine: NEGATIVE
Ketones, ur: NEGATIVE mg/dL
Leukocytes, UA: NEGATIVE
Nitrite: NEGATIVE
Nitrite: NEGATIVE
Protein, ur: NEGATIVE mg/dL
Specific Gravity, Urine: 1.027 (ref 1.005–1.030)
Urobilinogen, UA: 0.2 mg/dL (ref 0.0–1.0)
Urobilinogen, UA: 0.2 mg/dL (ref 0.0–1.0)
pH: 5.5 (ref 5.0–8.0)

## 2012-11-15 LAB — COMPREHENSIVE METABOLIC PANEL
Albumin: 4.2 g/dL (ref 3.5–5.2)
Alkaline Phosphatase: 75 U/L (ref 39–117)
BUN: 10 mg/dL (ref 6–23)
Creatinine, Ser: 0.61 mg/dL (ref 0.50–1.10)
GFR calc Af Amer: >90 mL/min (ref >90)
Glucose, Bld: 117 mg/dL — ABNORMAL HIGH (ref 70–99)
Total Bilirubin: 0.3 mg/dL (ref 0.3–1.2)
Total Protein: 8.1 g/dL (ref 6.0–8.3)

## 2012-11-15 LAB — URINE MICROSCOPIC-ADD ON

## 2012-11-15 LAB — LIPASE, BLOOD: Lipase: 23 U/L (ref 11–59)

## 2012-11-15 MED ORDER — ONDANSETRON HCL 4 MG/2ML IJ SOLN
4.0000 mg | Freq: Once | INTRAMUSCULAR | Status: AC
  Administered 2012-11-15: 4 mg via INTRAVENOUS
  Filled 2012-11-15: qty 2

## 2012-11-15 MED ORDER — MORPHINE SULFATE 4 MG/ML IJ SOLN
4.0000 mg | Freq: Once | INTRAMUSCULAR | Status: AC
  Administered 2012-11-15: 4 mg via INTRAVENOUS
  Filled 2012-11-15: qty 1

## 2012-11-15 NOTE — ED Notes (Signed)
Pt in with c/o lower back pain, right flank pain and increased urination x 24 hrs. + N

## 2012-11-15 NOTE — ED Notes (Signed)
The pt has had flank and abd pain for 2 days her last bm was Thursday and she took a laxative this am

## 2012-11-15 NOTE — ED Provider Notes (Signed)
CSN: [id number]     Arrival date & time 11/15/12  2202 History     First MD Initiated Contact with Patient 11/15/12 2301     Chief Complaint  Patient presents with  . Flank Pain   Patient is a 62 y.o. female presenting with flank pain. The history is provided by the patient.  Flank Pain This is a new problem. The current episode started 2 days ago. The problem occurs daily. The problem has been gradually worsening. Associated symptoms include abdominal pain and shortness of breath. Pertinent negatives include no chest pain. Exacerbated by: movement. Nothing relieves the symptoms. She has tried rest for the symptoms. The treatment provided no relief.  pt reports right flank pain, low abdominal pain, and urinary frequency for past 2 days No fever She reports nausea No cp, but she does report fatigue and SOB No focal weakness  She reports h/o AAA but managed non-operatively She reports she has never had this pain previously Past Medical History  Diagnosis Date  . GERD (gastroesophageal reflux disease)   . Hyperlipidemia   . Hypertension   . History of colon polyps 04/10/11?    [facility]--Dr Noe Gens  . Hidradenitis suppurativa 12/21/2011  . Microscopic hematuria     had neg cystoscopy  1/08 Dr Sabino Gasser  . OSA (obstructive sleep apnea) 05/21/2012   Past Surgical History  Procedure Laterality Date  . Tubal ligation  1977   Family History  Problem Relation Age of Onset  . Diabetes Son     type I  . Leukemia Maternal Aunt   . Heart disease Maternal Uncle   . Hypertension Maternal Uncle   . Diabetes Maternal Uncle   . Stroke Maternal Grandmother   . Cancer Cousin     lung?   History  Substance Use Topics  . Smoking status: Former Smoker -- 1.00 packs/day for 35 years    Types: Cigarettes    Quit date: 01/29/2012  . Smokeless tobacco: Never Used  . Alcohol Use: 7.2 oz/week    12 Cans of beer per week   OB History   Grav Para Term Preterm Abortions TAB SAB Ect  Mult Living                 Review of Systems  Constitutional: Positive for fatigue.  Respiratory: Positive for shortness of breath.   Cardiovascular: Negative for chest pain.  Gastrointestinal: Positive for abdominal pain. Negative for vomiting.  Genitourinary: Positive for frequency and flank pain.  Musculoskeletal: Positive for back pain.  Neurological: Negative for weakness.  All other systems reviewed and are negative.    Allergies  Review of patient's allergies indicates no known allergies.  Home Medications   Current Outpatient Rx  Name  Route  Sig  Dispense  Refill  . Amlodipine-Valsartan-HCTZ (EXFORGE HCT) 5-160-12.5 MG TABS   Oral   Take 1 tablet by mouth daily.         Marland Kitchen aspirin-sod bicarb-citric acid (ALKA-SELTZER) 325 MG TBEF tablet   Oral   Take 325 mg by mouth every 6 (six) hours as needed (for acid reflux).         Marland Kitchen atorvastatin (LIPITOR) 80 MG tablet   Oral   Take 1 tablet (80 mg total) by mouth daily.   30 tablet   5   . carvedilol (COREG) 25 MG tablet   Oral   Take 12.5 mg by mouth 2 (two) times daily with a meal.  BP 158/81  Pulse 75  Temp(Src) 98.5 F (36.9 C) (Oral)  Resp 16  SpO2 100% Physical Exam CONSTITUTIONAL: Well developed/well nourished HEAD: Normocephalic/atraumatic EYES: EOMI/PERRL ENMT: Mucous membranes moist NECK: supple no meningeal signs SPINE:entire spine nontender CV: S1/S2 noted, no murmurs/rubs/gallops noted LUNGS: Lungs are clear to auscultation bilaterally, no apparent distress ABDOMEN: soft, nontender, no rebound or guarding AO:ZHYQM cva tenderness NEURO: Pt is awake/alert, moves all extremitiesx4 EXTREMITIES: pulses normal, full ROM SKIN: warm, color normal PSYCH: no abnormalities of mood noted   ED Course   Procedures   Labs Reviewed  CBC WITH DIFFERENTIAL - Abnormal; Notable for the following:    Lymphs Abs 4.4 (*)    All other components within normal limits  COMPREHENSIVE METABOLIC  PANEL - Abnormal; Notable for the following:    Glucose, Bld 117 (*)    All other components within normal limits  URINALYSIS, ROUTINE W REFLEX MICROSCOPIC - Abnormal; Notable for the following:    Hgb urine dipstick LARGE (*)    All other components within normal limits  URINE MICROSCOPIC-ADD ON - Abnormal; Notable for the following:    Bacteria, UA FEW (*)    All other components within normal limits  LIPASE, BLOOD   11:39 PM Pt with flank pain with urinary frequency However given h/o AAA and denies known h/o kidney stone, will obtain CT imaging 12:44 AM Pt feels improved CT imaging negative for acute disease When reviewed chart from [facility], AAA appears stable (last mentioned at 3.4cm) Pt is well appearing She was informed of need for repeat chest imaging due to pulm nodule We discussed strict return precautions  MDM  Nursing notes including past medical history and social history reviewed and considered in documentation Labs/vital reviewed and considered     Date: 11/15/2012 2339  Rate: 67  Rhythm: normal sinus rhythm  QRS Axis: normal  Intervals: normal  ST/T Wave abnormalities: nonspecific ST changes  Conduction Disutrbances:none  Narrative Interpretation:   Old EKG Reviewed: unchanged from 05/21/12      Joya Gaskins, MD 11/16/12 [phone]

## 2012-11-16 MED ORDER — OXYCODONE-ACETAMINOPHEN 5-325 MG PO TABS: 1.0000 | ORAL_TABLET | ORAL | Status: DC | PRN

## 2012-11-16 MED ORDER — ONDANSETRON 4 MG PO TBDP: ORAL_TABLET | ORAL | Status: DC

## 2012-11-17 LAB — URINE CULTURE: Colony Count: 8000

## 2014-02-05 ENCOUNTER — Telehealth: Payer: Self-pay | Admitting: Family

## 2014-02-05 DIAGNOSIS — R911 Solitary pulmonary nodule: Secondary | ICD-10-CM

## 2014-02-05 NOTE — Telephone Encounter (Signed)
Left message for pt to return my call.

## 2014-02-05 NOTE — Telephone Encounter (Signed)
Please let pt know that I reviewed her chart and see that she is due for a follow up CT chest to evaluate lung nodule that we saw last time.  I want to make sure that it remains stable in size.

## 2014-02-09 NOTE — Telephone Encounter (Signed)
Left detailed message on home # that we are going to proceed with CT chest and will contact her with appt date/time and to call if any questions.

## 2014-02-17 ENCOUNTER — Ambulatory Visit (HOSPITAL_BASED_OUTPATIENT_CLINIC_OR_DEPARTMENT_OTHER)
Admission: RE | Admit: 2014-02-17 | Discharge: 2014-02-17 | Disposition: A | Discharge status: Home / Self Care | Payer: BC Managed Care – PPO | Source: Ambulatory Visit | Attending: Family | Admitting: Family

## 2014-02-17 DIAGNOSIS — R911 Solitary pulmonary nodule: Secondary | ICD-10-CM | POA: Diagnosis present

## 2014-02-17 DIAGNOSIS — J432 Centrilobular emphysema: Secondary | ICD-10-CM | POA: Diagnosis not present

## 2014-02-17 DIAGNOSIS — Z87891 Personal history of nicotine dependence: Secondary | ICD-10-CM | POA: Diagnosis not present

## 2014-03-02 ENCOUNTER — Telehealth: Payer: Self-pay

## 2014-03-02 NOTE — Telephone Encounter (Signed)
Patient presented to front desk for a copy of her CT report. Report printed, reviewed with patient per provider notations. Patient states understanding.

## 2015-08-30 ENCOUNTER — Telehealth: Payer: Self-pay | Admitting: Family

## 2015-08-30 DIAGNOSIS — R911 Solitary pulmonary nodule: Secondary | ICD-10-CM

## 2015-08-30 NOTE — Telephone Encounter (Signed)
Please contact patient and let her know that I reviewed her record and see that she is due for a follow up CT chest to reassess a lung nodule.  She is also due for follow up in the office.  I have pended CT chest.

## 2015-08-30 NOTE — Telephone Encounter (Signed)
Left message for pt to return my call.

## 2015-08-31 NOTE — Telephone Encounter (Signed)
Notified pt of below recommendation. She states that she is now seeing Dr Luiz Ironabeza and he is monitoring this for her. Order removed.

## 2015-12-17 ENCOUNTER — Emergency Department (HOSPITAL_COMMUNITY): Payer: Medicare Other

## 2015-12-17 ENCOUNTER — Encounter (HOSPITAL_COMMUNITY): Payer: Self-pay | Admitting: Emergency Medicine

## 2015-12-17 ENCOUNTER — Inpatient Hospital Stay (HOSPITAL_COMMUNITY)
Admission: EM | Admit: 2015-12-17 | Discharge: 2015-12-21 | DRG: 395 | Disposition: A | Discharge status: Home / Self Care | Payer: Medicare Other | Attending: Internal Medicine | Admitting: Internal Medicine

## 2015-12-17 DIAGNOSIS — K449 Diaphragmatic hernia without obstruction or gangrene: Secondary | ICD-10-CM | POA: Diagnosis not present

## 2015-12-17 DIAGNOSIS — K5900 Constipation, unspecified: Secondary | ICD-10-CM | POA: Diagnosis present

## 2015-12-17 DIAGNOSIS — I714 Abdominal aortic aneurysm, without rupture, unspecified: Secondary | ICD-10-CM | POA: Diagnosis present

## 2015-12-17 DIAGNOSIS — R109 Unspecified abdominal pain: Secondary | ICD-10-CM | POA: Diagnosis present

## 2015-12-17 DIAGNOSIS — Z8249 Family history of ischemic heart disease and other diseases of the circulatory system: Secondary | ICD-10-CM | POA: Diagnosis not present

## 2015-12-17 DIAGNOSIS — Z87891 Personal history of nicotine dependence: Secondary | ICD-10-CM | POA: Diagnosis not present

## 2015-12-17 DIAGNOSIS — Z833 Family history of diabetes mellitus: Secondary | ICD-10-CM

## 2015-12-17 DIAGNOSIS — R634 Abnormal weight loss: Secondary | ICD-10-CM | POA: Diagnosis present

## 2015-12-17 DIAGNOSIS — Z823 Family history of stroke: Secondary | ICD-10-CM

## 2015-12-17 DIAGNOSIS — R1011 Right upper quadrant pain: Secondary | ICD-10-CM

## 2015-12-17 DIAGNOSIS — Z86010 Personal history of colonic polyps: Secondary | ICD-10-CM

## 2015-12-17 DIAGNOSIS — K219 Gastro-esophageal reflux disease without esophagitis: Secondary | ICD-10-CM | POA: Diagnosis present

## 2015-12-17 DIAGNOSIS — Z806 Family history of leukemia: Secondary | ICD-10-CM | POA: Diagnosis not present

## 2015-12-17 DIAGNOSIS — K660 Peritoneal adhesions (postprocedural) (postinfection): Secondary | ICD-10-CM | POA: Diagnosis not present

## 2015-12-17 DIAGNOSIS — Z6830 Body mass index (BMI) 30.0-30.9, adult: Secondary | ICD-10-CM

## 2015-12-17 DIAGNOSIS — R1013 Epigastric pain: Secondary | ICD-10-CM | POA: Diagnosis present

## 2015-12-17 DIAGNOSIS — G4733 Obstructive sleep apnea (adult) (pediatric): Secondary | ICD-10-CM | POA: Diagnosis not present

## 2015-12-17 DIAGNOSIS — E785 Hyperlipidemia, unspecified: Secondary | ICD-10-CM | POA: Diagnosis present

## 2015-12-17 DIAGNOSIS — I10 Essential (primary) hypertension: Secondary | ICD-10-CM | POA: Diagnosis present

## 2015-12-17 DIAGNOSIS — Z23 Encounter for immunization: Secondary | ICD-10-CM | POA: Diagnosis not present

## 2015-12-17 HISTORY — DX: Abdominal aortic aneurysm, without rupture: I71.4

## 2015-12-17 HISTORY — DX: Abdominal aortic aneurysm, without rupture, unspecified: I71.40

## 2015-12-17 LAB — BASIC METABOLIC PANEL
Anion gap: 8 (ref 5–15)
CALCIUM: 9.4 mg/dL (ref 8.9–10.3)
CO2: 26 mmol/L (ref 22–32)
Chloride: 104 mmol/L (ref 101–111)
Creatinine, Ser: 0.65 mg/dL (ref 0.44–1.00)
GFR calc Af Amer: >60 mL/min (ref >60)
GLUCOSE: 146 mg/dL — AB (ref 65–99)
Potassium: 3.3 mmol/L — ABNORMAL LOW (ref 3.5–5.1)
Sodium: 138 mmol/L (ref 135–145)

## 2015-12-17 LAB — I-STAT TROPONIN, ED: Troponin i, poc: 0 ng/mL (ref 0.00–0.08)

## 2015-12-17 LAB — CBC
HEMATOCRIT: 38.5 % (ref 36.0–46.0)
Hemoglobin: 12.7 g/dL (ref 12.0–15.0)
MCH: 26.7 pg (ref 26.0–34.0)
MCHC: 33 g/dL (ref 30.0–36.0)
MCV: 80.9 fL (ref 78.0–100.0)
Platelets: 419 10*3/uL — ABNORMAL HIGH (ref 150–400)
RBC: 4.76 MIL/uL (ref 3.87–5.11)
RDW: 15.9 % — ABNORMAL HIGH (ref 11.5–15.5)
WBC: 7.8 10*3/uL (ref 4.0–10.5)

## 2015-12-17 LAB — LIPASE, BLOOD: LIPASE: 37 U/L (ref 11–51)

## 2015-12-17 MED ORDER — IOPAMIDOL (ISOVUE-370) INJECTION 76%
INTRAVENOUS | Status: AC
  Administered 2015-12-17: 100 mL
  Filled 2015-12-17: qty 100

## 2015-12-17 NOTE — ED Provider Notes (Signed)
[facility]-EMERGENCY DEPT Provider Note   CSN: [id number] Arrival date & time: 12/17/15  2003     History   Chief Complaint Chief Complaint  Patient presents with  . Chest Pain    HPI Pilar PlateGloria B Sianez is a 65 y.o. female.  HPI   Patient is a 65 year old female with a history of GERD, HTN, AAA who presents the emergency department with epigastric/substernal intermittent pain since Friday morning. This pain is sharp, intermittent, nonradiating, 8/10. She describes a short episodes but frequent. She tried Alka-Seltzer and 2 Gas-X without relief. Patient denies dizziness, headache, chest pain, shortness of breath, nausea, vomiting, diarrhea, hematuria, dysuria.  Past Medical History:  Diagnosis Date  . AAA (abdominal aortic aneurysm) (HCC)   . GERD (gastroesophageal reflux disease)   . Hidradenitis suppurativa 12/21/2011  . History of colon polyps 04/10/11?   [facility]--Dr Noe GensPeters  . Hyperlipidemia   . Hypertension   . Microscopic hematuria    had neg cystoscopy  1/08 Dr Sabino GasserMullins  . OSA (obstructive sleep apnea) 05/21/2012    Patient Active Problem List   Diagnosis Date Noted  . SOB (shortness of breath) on exertion 05/21/2012  . Urinary incontinence 05/21/2012  . OSA (obstructive sleep apnea) 05/21/2012  . Osteoarthritis 05/21/2012  . Routine general medical examination at a health care facility 01/04/2012  . Hidradenitis suppurativa 12/21/2011  . HTN (hypertension) 12/21/2011  . Hyperlipidemia 12/21/2011  . GERD (gastroesophageal reflux disease) 12/21/2011  . Hx of colonic polyps 12/21/2011  . AAA (abdominal aortic aneurysm) (HCC) 12/21/2011    Past Surgical History:  Procedure Laterality Date  . TUBAL LIGATION  1977    OB History    No data available       Home Medications    Prior to Admission medications   Medication Sig Start Date End Date Taking? Authorizing Provider  aspirin-sod bicarb-citric acid (ALKA-SELTZER) 325 MG TBEF tablet Take 325 mg by  mouth every 6 (six) hours as needed (for acid reflux).   Yes Historical Provider, MD  atorvastatin (LIPITOR) 80 MG tablet Take 1 tablet (80 mg total) by mouth daily. 12/21/11  Yes Sandford CrazeMelissa O'Sullivan, NP  benazepril (LOTENSIN) 40 MG tablet Take 40 mg by mouth 2 (two) times daily. 08/29/15  Yes Historical Provider, MD  carvedilol (COREG) 25 MG tablet Take 12.5 mg by mouth 2 (two) times daily with a meal.   Yes Historical Provider, MD  escitalopram (LEXAPRO) 10 MG tablet Take 10 mg by mouth every morning. 11/23/15 02/21/16 Yes Historical Provider, MD  ferrous sulfate 325 (65 FE) MG EC tablet Take 325 mg by mouth 2 (two) times daily. 12/09/15 01/08/16 Yes Historical Provider, MD  pantoprazole (PROTONIX) 40 MG tablet Take 40 mg by mouth 2 (two) times daily. 07/28/15  Yes Historical Provider, MD  Potassium Chloride ER 20 MEQ TBCR Take 20 mEq by mouth 2 (two) times daily. 12/07/15  Yes Historical Provider, MD  ondansetron (ZOFRAN ODT) 4 MG disintegrating tablet 4mg  ODT q4 hours prn nausea/vomit Patient not taking: Reported on 12/17/2015 11/16/12   Zadie Rhineonald Wickline, MD  oxyCODONE-acetaminophen (PERCOCET) 5-325 MG per tablet Take 1 tablet by mouth every 4 (four) hours as needed for pain. Patient not taking: Reported on 12/17/2015 11/16/12   Zadie Rhineonald Wickline, MD    Family History Family History  Problem Relation Age of Onset  . Diabetes Son     type I  . Leukemia Maternal Aunt   . Stroke Maternal Grandmother   . Cancer Cousin  lung?  . Heart disease Maternal Uncle   . Hypertension Maternal Uncle   . Diabetes Maternal Uncle     Social History Social History  Substance Use Topics  . Smoking status: Former Smoker    Packs/day: 1.00    Years: 35.00    Types: Cigarettes    Quit date: 01/29/2012  . Smokeless tobacco: Never Used  . Alcohol use 7.2 oz/week    12 Cans of beer per week     Allergies   Review of patient's allergies indicates no known allergies.   Review of Systems Review of Systems    Constitutional: Negative for chills and fever.  Eyes: Negative for visual disturbance.  Respiratory: Negative for chest tightness and shortness of breath.   Cardiovascular: Positive for chest pain.  Gastrointestinal: Positive for abdominal pain. Negative for diarrhea, nausea and vomiting.  Genitourinary: Negative for dysuria and hematuria.  Musculoskeletal: Negative for back pain and neck pain.  Skin: Negative for rash.  Neurological: Negative for dizziness, syncope, speech difficulty, weakness, light-headedness and headaches.  Psychiatric/Behavioral: Negative for confusion.     Physical Exam Updated Vital Signs BP 191/99 (BP Location: Right Arm)   Pulse 61   Temp 97.6 F (36.4 C) (Oral)   Resp 14   SpO2 97%   Physical Exam  Constitutional: She appears well-developed and well-nourished. No distress.  HENT:  Head: Normocephalic and atraumatic.  Eyes: Conjunctivae are normal.  Cardiovascular: Normal rate, regular rhythm and normal heart sounds.  Exam reveals no gallop and no friction rub.   No murmur heard. Pulses:      Radial pulses are 2+ on the right side, and 2+ on the left side.       Dorsalis pedis pulses are 2+ on the right side, and 2+ on the left side.  Pulmonary/Chest: Effort normal. No respiratory distress. She has no wheezes. She has no rales. She exhibits no tenderness.  Abdominal: Normal appearance and bowel sounds are normal. She exhibits no distension, no pulsatile midline mass and no mass. There is generalized tenderness. There is positive Murphy's sign. There is no rigidity, no rebound, no guarding and no CVA tenderness.  Generalized abdominal tenderness worse in the epigastric, right upper quadrant regions  Musculoskeletal: Normal range of motion.  Neurological: She is alert. Coordination normal.  Skin: Skin is warm and dry. She is not diaphoretic.  Psychiatric: She has a normal mood and affect. Her behavior is normal.  Nursing note and vitals  reviewed.    ED Treatments / Results  Labs (all labs ordered are listed, but only abnormal results are displayed) Labs Reviewed  BASIC METABOLIC PANEL - Abnormal; Notable for the following:       Result Value   Potassium 3.3 (*)    Glucose, Bld 146 (*)    BUN <5 (*)    All other components within normal limits  CBC - Abnormal; Notable for the following:    RDW 15.9 (*)    Platelets 419 (*)    All other components within normal limits  LIPASE, BLOOD  I-STAT TROPOININ, ED    EKG  EKG Interpretation None       Radiology Dg Chest 2 View  Result Date: 12/17/2015 CLINICAL DATA:  Pain EXAM: CHEST  2 VIEW COMPARISON:  12/07/2015 FINDINGS: The heart size and mediastinal contours are within normal limits. Both lungs are clear. The visualized skeletal structures are unremarkable. IMPRESSION: No active cardiopulmonary disease. Electronically Signed   By: Veronda Prude.D.  On: 12/17/2015 21:15   Ct Angio Chest/abd/pel For Dissection W And/or Wo Contrast  Addendum Date: 12/18/2015   ADDENDUM REPORT: 12/18/2015 00:42 ADDENDUM: Additional comparison is made to the CT of the abdomen pelvis dated 12/15/2015. Indistinct aortic wall and periaortic fat is similar to the study dated 12/15/2015 and may be related to chronic inflammation or scarring or represent a more acute process. Clinical correlation is recommended. No definite active extravasation of contrast identified. Electronically Signed   By: Elgie Collard M.D.   On: 12/18/2015 00:42   Result Date: 12/18/2015 CLINICAL DATA:  65 year old female with epigastric radiating to the back. EXAM: CT ANGIOGRAPHY CHEST, ABDOMEN AND PELVIS TECHNIQUE: Multidetector CT imaging through the chest, abdomen and pelvis was performed using the standard protocol during bolus administration of intravenous contrast. Multiplanar reconstructed images and MIPs were obtained and reviewed to evaluate the vascular anatomy. CONTRAST:  Its 100 cc Isovue 370  COMPARISON:  Chest CT dated 03/11/2015 and abdominal CT dated 11/15/2012 FINDINGS: CTA CHEST FINDINGS There is mild centrilobular and paraseptal emphysema. The lungs are clear. There is no pleural effusion or pneumothorax. The central airways are patent. There is mild atherosclerotic calcification of thoracic aorta. There is no aneurysmal dilatation or evidence of dissection. The origins of the great vessels of the aortic arch appear patent. There is no CT evidence of pulmonary embolism. A top-normal cardiac size. There is coronary vascular calcification. There is no pericardial effusion. Multiple top-normal mediastinal lymph node in the perivascular space, cardiopulmonary window, and right paratracheal region of indeterminate clinical significance. There is no hilar adenopathy. The esophagus is grossly unremarkable. No thyroid nodules identified. There is no axillary adenopathy. The chest wall soft tissues appear unremarkable. There is osteopenia with degenerative changes of spine. No acute fracture. Review of the MIP images confirms the above findings. CTA ABDOMEN AND PELVIS FINDINGS No intra-abdominal free air. Small hypoattenuating area in the right posterior hemipelvis may represent ovarian tissue versus trace free fluid. The liver is, gallbladder, pancreas, spleen, and adrenal glands appear unremarkable. The kidneys, visualized ureters, and urinary bladder appear unremarkable. There is apparent diffuse thickening of the bladder wall which may be partly related to underdistention. Cystitis is not excluded. Correlation with urinalysis recommended. There is redundancy of the sigmoid colon in. There is no evidence of bowel obstruction or active inflammation. Normal appendix. There is advanced aortoiliac atherosclerotic disease. There are diffuse noncalcified plaques versus less likely mural hematoma along the course of the abdominal aorta and common iliac arteries. There is no aortic dissection. There is a  fusiform infrarenal abdominal aortic aneurysm measuring up to 4.3 cm in greatest transverse axial diameter, increased in size since the prior CT of 11/15/2012 where it measured approximately 3.7 cm. There is partial thrombosis of the aneurysmal lumen chest above the bifurcation of the aorta. There is no extravasation of contrast or evidence of active hemorrhage. However, there is diffuse thickened appearance of the aortic wall and aneurysmal sac with infiltration of the immediate periaortic fat. Therefore mild intramural hemorrhage or mild aortic leak with potential for impending rupture is not excluded. Clinical correlation and vascular surgery consult is advised. The origins of the celiac axis, SMA, IMA as well as the origins of the renal arteries appear patent. There is a standard celiac axis branching anatomy. There are aneurysmal dilatation of the common iliac arteries measuring approximately 2.5 cm in diameter. The external and internal iliac arteries appear patent. No portal venous gas identified. There is no adenopathy. The abdominal wall soft  tissues appear unremarkable. There is mild degenerative changes of the spine. No acute fracture. Review of the MIP images confirms the above findings. IMPRESSION: No CT evidence of thoracic aortic aneurysm or dissection. Fusiform infrarenal abdominal aortic aneurysm measuring up to 4.3 cm in axial diameter and increased since the study dated 2014 as well as aneurysmal dilatation of the common iliac arteries. No active extravasation of contrast identified. However, there is diffusely thickened appearance of the wall of the aorta with infiltrated appearance of the periaortic fat concerning for mural hemorrhage or mild periaortic leak. An impending rupture is not excluded. Correlation with clinical exam and vascular surgery consult is advised. 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 These results were called by telephone at the  time of interpretation on 12/18/2015 at 12:20 am to Dr. Mattie Marlin , who verbally acknowledged these results. Electronically Signed: By: Elgie Collard M.D. On: 12/18/2015 00:22    Procedures Procedures (including critical care time)  Medications Ordered in ED Medications  iopamidol (ISOVUE-370) 76 % injection (100 mLs  Contrast Given 12/17/15 2247)     Initial Impression / Assessment and Plan / ED Course  I have reviewed the triage vital signs and the nursing notes.  Pertinent labs & imaging results that were available during my care of the patient were reviewed by me and considered in my medical decision making (see chart for details).  Clinical Course   Pt with epigastric pain. CT angio chest/abd/pelvis reviewed by me and after speaking to the radiologist and the vascular surgeon revealed no acute abnormalities from last CT on 12/15/2015. Pain could be 2/2 gastric spasms. Pt has a history of alcohol abuse, lipase normal. Checking LFT's. Could be bilary Colic, although CT scan did not reveal any abnormalities of the gallbladder. 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 No other acute abnormalities were found.   Spoke with Dr. Arbie Cookey, vascular surgery, who will come see the pt. Dr. Arbie Cookey does not believe her pain is related to her AAA. He states no changes from her CT on 9/7.   Pt and family are requesting admission. Will consult hospitalist team for admisison.   Pt care signed out to Dr. Bebe Shaggy at change of shift. He will determine the pt's disposition after consulting with the hospitalist team and reviewing pending LFT's.   Final Clinical Impressions(s) / ED Diagnoses   Final diagnoses:  None    New Prescriptions New Prescriptions   No medications on file     Jerre Simon, [place] 12/18/15 0150    Zadie Rhine, MD 12/18/15 [id number]

## 2015-12-17 NOTE — ED Triage Notes (Signed)
Patient with chest pain, central chest.  She denies any radiation, shortness of breath, nausea or vomiting at this time.  She states she has a history of AAA, she gets checked every 6 months.  She states that she felt like she has gas.

## 2015-12-18 ENCOUNTER — Inpatient Hospital Stay (HOSPITAL_COMMUNITY): Payer: Medicare Other

## 2015-12-18 DIAGNOSIS — Z823 Family history of stroke: Secondary | ICD-10-CM | POA: Diagnosis not present

## 2015-12-18 DIAGNOSIS — I10 Essential (primary) hypertension: Secondary | ICD-10-CM | POA: Diagnosis not present

## 2015-12-18 DIAGNOSIS — K660 Peritoneal adhesions (postprocedural) (postinfection): Secondary | ICD-10-CM | POA: Diagnosis present

## 2015-12-18 DIAGNOSIS — R109 Unspecified abdominal pain: Secondary | ICD-10-CM | POA: Diagnosis not present

## 2015-12-18 DIAGNOSIS — K219 Gastro-esophageal reflux disease without esophagitis: Secondary | ICD-10-CM | POA: Diagnosis not present

## 2015-12-18 DIAGNOSIS — K449 Diaphragmatic hernia without obstruction or gangrene: Secondary | ICD-10-CM | POA: Diagnosis present

## 2015-12-18 DIAGNOSIS — G4733 Obstructive sleep apnea (adult) (pediatric): Secondary | ICD-10-CM | POA: Diagnosis present

## 2015-12-18 DIAGNOSIS — Z86010 Personal history of colonic polyps: Secondary | ICD-10-CM | POA: Diagnosis not present

## 2015-12-18 DIAGNOSIS — I714 Abdominal aortic aneurysm, without rupture: Secondary | ICD-10-CM

## 2015-12-18 DIAGNOSIS — R1011 Right upper quadrant pain: Secondary | ICD-10-CM

## 2015-12-18 DIAGNOSIS — R1013 Epigastric pain: Secondary | ICD-10-CM

## 2015-12-18 DIAGNOSIS — Z806 Family history of leukemia: Secondary | ICD-10-CM | POA: Diagnosis not present

## 2015-12-18 DIAGNOSIS — Z6830 Body mass index (BMI) 30.0-30.9, adult: Secondary | ICD-10-CM | POA: Diagnosis not present

## 2015-12-18 DIAGNOSIS — R634 Abnormal weight loss: Secondary | ICD-10-CM

## 2015-12-18 DIAGNOSIS — Z8249 Family history of ischemic heart disease and other diseases of the circulatory system: Secondary | ICD-10-CM | POA: Diagnosis not present

## 2015-12-18 DIAGNOSIS — Z23 Encounter for immunization: Secondary | ICD-10-CM | POA: Diagnosis not present

## 2015-12-18 DIAGNOSIS — Z87891 Personal history of nicotine dependence: Secondary | ICD-10-CM | POA: Diagnosis not present

## 2015-12-18 DIAGNOSIS — Z833 Family history of diabetes mellitus: Secondary | ICD-10-CM | POA: Diagnosis not present

## 2015-12-18 DIAGNOSIS — K5900 Constipation, unspecified: Secondary | ICD-10-CM | POA: Diagnosis present

## 2015-12-18 DIAGNOSIS — E785 Hyperlipidemia, unspecified: Secondary | ICD-10-CM | POA: Diagnosis present

## 2015-12-18 LAB — URINALYSIS, ROUTINE W REFLEX MICROSCOPIC
BILIRUBIN URINE: NEGATIVE
GLUCOSE, UA: NEGATIVE mg/dL
HGB URINE DIPSTICK: NEGATIVE
Ketones, ur: NEGATIVE mg/dL
Leukocytes, UA: NEGATIVE
Nitrite: NEGATIVE
PH: 7.5 (ref 5.0–8.0)
Protein, ur: NEGATIVE mg/dL
SPECIFIC GRAVITY, URINE: 1.012 (ref 1.005–1.030)

## 2015-12-18 LAB — HEPATIC FUNCTION PANEL
ALBUMIN: 3.1 g/dL — AB (ref 3.5–5.0)
ALK PHOS: 59 U/L (ref 38–126)
ALT: 14 U/L (ref 14–54)
AST: 28 U/L (ref 15–41)
BILIRUBIN TOTAL: 0.4 mg/dL (ref 0.3–1.2)
Bilirubin, Direct: <0.1 mg/dL — ABNORMAL LOW (ref 0.1–0.5)
Total Protein: 6.9 g/dL (ref 6.5–8.1)

## 2015-12-18 LAB — TROPONIN I
Troponin I: 0.03 ng/mL (ref <0.03)
Troponin I: <0.03 ng/mL (ref <0.03)

## 2015-12-18 LAB — SEDIMENTATION RATE: Sed Rate: 41 mm/hr — ABNORMAL HIGH (ref 0–22)

## 2015-12-18 LAB — MRSA PCR SCREENING: MRSA BY PCR: NEGATIVE

## 2015-12-18 MED ORDER — DOCUSATE SODIUM 100 MG PO CAPS
100.0000 mg | ORAL_CAPSULE | Freq: Two times a day (BID) | ORAL | Status: DC
  Administered 2015-12-18 – 2015-12-21 (×7): 100 mg via ORAL
  Filled 2015-12-18 (×7): qty 1

## 2015-12-18 MED ORDER — ACETAMINOPHEN 325 MG PO TABS
650.0000 mg | ORAL_TABLET | Freq: Once | ORAL | Status: AC
  Administered 2015-12-18: 650 mg via ORAL
  Filled 2015-12-18: qty 2

## 2015-12-18 MED ORDER — ACETAMINOPHEN 325 MG PO TABS
650.0000 mg | ORAL_TABLET | Freq: Four times a day (QID) | ORAL | Status: DC | PRN
  Administered 2015-12-18 – 2015-12-21 (×2): 650 mg via ORAL
  Filled 2015-12-18 (×2): qty 2

## 2015-12-18 MED ORDER — HYDRALAZINE HCL 20 MG/ML IJ SOLN
10.0000 mg | INTRAMUSCULAR | Status: DC | PRN
  Administered 2015-12-18: 20 mg via INTRAVENOUS
  Filled 2015-12-18: qty 1

## 2015-12-18 MED ORDER — PANTOPRAZOLE SODIUM 40 MG PO TBEC
40.0000 mg | DELAYED_RELEASE_TABLET | Freq: Two times a day (BID) | ORAL | Status: DC
  Administered 2015-12-18 – 2015-12-21 (×7): 40 mg via ORAL
  Filled 2015-12-18 (×7): qty 1

## 2015-12-18 MED ORDER — FENTANYL CITRATE (PF) 100 MCG/2ML IJ SOLN
50.0000 ug | Freq: Once | INTRAMUSCULAR | Status: AC
  Administered 2015-12-18: 50 ug via INTRAVENOUS
  Filled 2015-12-18: qty 2

## 2015-12-18 MED ORDER — ATORVASTATIN CALCIUM 80 MG PO TABS
80.0000 mg | ORAL_TABLET | Freq: Every day | ORAL | Status: DC
  Administered 2015-12-18 – 2015-12-20 (×2): 80 mg via ORAL
  Filled 2015-12-18 (×4): qty 1

## 2015-12-18 MED ORDER — ESCITALOPRAM OXALATE 10 MG PO TABS
10.0000 mg | ORAL_TABLET | Freq: Every day | ORAL | Status: DC
  Administered 2015-12-18 – 2015-12-21 (×4): 10 mg via ORAL
  Filled 2015-12-18 (×4): qty 1

## 2015-12-18 MED ORDER — FERROUS SULFATE 325 (65 FE) MG PO TABS
325.0000 mg | ORAL_TABLET | Freq: Two times a day (BID) | ORAL | Status: DC
  Administered 2015-12-18 – 2015-12-21 (×7): 325 mg via ORAL
  Filled 2015-12-18 (×7): qty 1

## 2015-12-18 MED ORDER — BENAZEPRIL HCL 10 MG PO TABS
40.0000 mg | ORAL_TABLET | Freq: Two times a day (BID) | ORAL | Status: DC
  Administered 2015-12-18 – 2015-12-21 (×6): 40 mg via ORAL
  Filled 2015-12-18 (×7): qty 4

## 2015-12-18 MED ORDER — CARVEDILOL 12.5 MG PO TABS
12.5000 mg | ORAL_TABLET | Freq: Two times a day (BID) | ORAL | Status: DC
  Administered 2015-12-18 – 2015-12-21 (×6): 12.5 mg via ORAL
  Filled 2015-12-18 (×7): qty 1

## 2015-12-18 MED ORDER — SODIUM CHLORIDE 0.9 % IV SOLN
INTRAVENOUS | Status: DC
  Administered 2015-12-18: 1000 mL via INTRAVENOUS
  Administered 2015-12-19: 08:00:00 via INTRAVENOUS

## 2015-12-18 MED ORDER — GI COCKTAIL ~~LOC~~
30.0000 mL | Freq: Three times a day (TID) | ORAL | Status: DC | PRN
  Administered 2015-12-18: 30 mL via ORAL
  Filled 2015-12-18 (×2): qty 30

## 2015-12-18 MED ORDER — POTASSIUM CHLORIDE CRYS ER 20 MEQ PO TBCR
20.0000 meq | EXTENDED_RELEASE_TABLET | Freq: Two times a day (BID) | ORAL | Status: AC
  Administered 2015-12-18 – 2015-12-19 (×3): 20 meq via ORAL
  Filled 2015-12-18 (×3): qty 1

## 2015-12-18 MED ORDER — PNEUMOCOCCAL VAC POLYVALENT 25 MCG/0.5ML IJ INJ
0.5000 mL | INJECTION | INTRAMUSCULAR | Status: AC
  Administered 2015-12-19: 0.5 mL via INTRAMUSCULAR
  Filled 2015-12-18: qty 0.5

## 2015-12-18 MED ORDER — SODIUM CHLORIDE 0.9% FLUSH
3.0000 mL | Freq: Two times a day (BID) | INTRAVENOUS | Status: DC
  Administered 2015-12-18 – 2015-12-20 (×5): 3 mL via INTRAVENOUS

## 2015-12-18 MED ORDER — POLYETHYLENE GLYCOL 3350 17 G PO PACK
17.0000 g | PACK | Freq: Every day | ORAL | Status: DC
  Administered 2015-12-18 – 2015-12-21 (×4): 17 g via ORAL
  Filled 2015-12-18 (×4): qty 1

## 2015-12-18 NOTE — ED Notes (Signed)
Attempted report. No answer on floor.

## 2015-12-18 NOTE — Progress Notes (Signed)
Subjective: Interval History: none.. Remains hemodynamically stable on the floor. Still with some right upper quadrant pain which comes and goes. Feels like gas. Denies any back pain.  Objective: Vital signs in last 24 hours: Temp:  [97.6 F (36.4 C)-97.7 F (36.5 C)] 97.7 F (36.5 C) (09/10 0440) Pulse Rate:  [53-63] 57 (09/10 0600) Resp:  [13-24] 17 (09/10 0600) BP: (128-191)/(60-116) 128/60 (09/10 0600) SpO2:  [94 %-100 %] 100 % (09/10 0600) Weight:  [178 lb 9.2 oz (81 kg)] 178 lb 9.2 oz (81 kg) (09/10 0440)  Intake/Output from previous day: No intake/output data recorded. Intake/Output this shift: No intake/output data recorded.  Abdomen soft nontender to palpation.  Lab Results:  Recent Labs  12/17/15 2013  WBC 7.8  HGB 12.7  HCT 38.5  PLT 419*   BMET  Recent Labs  12/17/15 2013  NA 138  K 3.3*  CL 104  CO2 26  GLUCOSE 146*  BUN <5*  CREATININE 0.65  CALCIUM 9.4    Studies/Results: Dg Chest 2 View  Result Date: 12/17/2015 CLINICAL DATA:  Pain EXAM: CHEST  2 VIEW COMPARISON:  12/07/2015 FINDINGS: The heart size and mediastinal contours are within normal limits. Both lungs are clear. The visualized skeletal structures are unremarkable. IMPRESSION: No active cardiopulmonary disease. Electronically Signed   By: Signa Kellaylor  Stroud M.D.   On: 12/17/2015 21:15   Ct Angio Chest/abd/pel For Dissection W And/or Wo Contrast  Addendum Date: 12/18/2015   ADDENDUM REPORT: 12/18/2015 00:42 ADDENDUM: Additional comparison is made to the CT of the abdomen pelvis dated 12/15/2015. Indistinct aortic wall and periaortic fat is similar to the study dated 12/15/2015 and may be related to chronic inflammation or scarring or represent a more acute process. Clinical correlation is recommended. No definite active extravasation of contrast identified. Electronically Signed   By: Elgie CollardArash  Radparvar M.D.   On: 12/18/2015 00:42   Result Date: 12/18/2015 CLINICAL DATA:  65 year old female with  epigastric radiating to the back. EXAM: CT ANGIOGRAPHY CHEST, ABDOMEN AND PELVIS TECHNIQUE: Multidetector CT imaging through the chest, abdomen and pelvis was performed using the standard protocol during bolus administration of intravenous contrast. Multiplanar reconstructed images and MIPs were obtained and reviewed to evaluate the vascular anatomy. CONTRAST:  Its 100 cc Isovue 370 COMPARISON:  Chest CT dated 03/11/2015 and abdominal CT dated 11/15/2012 FINDINGS: CTA CHEST FINDINGS There is mild centrilobular and paraseptal emphysema. The lungs are clear. There is no pleural effusion or pneumothorax. The central airways are patent. There is mild atherosclerotic calcification of thoracic aorta. There is no aneurysmal dilatation or evidence of dissection. The origins of the great vessels of the aortic arch appear patent. There is no CT evidence of pulmonary embolism. A top-normal cardiac size. There is coronary vascular calcification. There is no pericardial effusion. Multiple top-normal mediastinal lymph node in the perivascular space, cardiopulmonary window, and right paratracheal region of indeterminate clinical significance. There is no hilar adenopathy. The esophagus is grossly unremarkable. No thyroid nodules identified. There is no axillary adenopathy. The chest wall soft tissues appear unremarkable. There is osteopenia with degenerative changes of spine. No acute fracture. Review of the MIP images confirms the above findings. CTA ABDOMEN AND PELVIS FINDINGS No intra-abdominal free air. Small hypoattenuating area in the right posterior hemipelvis may represent ovarian tissue versus trace free fluid. The liver is, gallbladder, pancreas, spleen, and adrenal glands appear unremarkable. The kidneys, visualized ureters, and urinary bladder appear unremarkable. There is apparent diffuse thickening of the bladder wall which may be  partly related to underdistention. Cystitis is not excluded. Correlation with  urinalysis recommended. There is redundancy of the sigmoid colon in. There is no evidence of bowel obstruction or active inflammation. Normal appendix. There is advanced aortoiliac atherosclerotic disease. There are diffuse noncalcified plaques versus less likely mural hematoma along the course of the abdominal aorta and common iliac arteries. There is no aortic dissection. There is a fusiform infrarenal abdominal aortic aneurysm measuring up to 4.3 cm in greatest transverse axial diameter, increased in size since the prior CT of 11/15/2012 where it measured approximately 3.7 cm. There is partial thrombosis of the aneurysmal lumen chest above the bifurcation of the aorta. There is no extravasation of contrast or evidence of active hemorrhage. However, there is diffuse thickened appearance of the aortic wall and aneurysmal sac with infiltration of the immediate periaortic fat. Therefore mild intramural hemorrhage or mild aortic leak with potential for impending rupture is not excluded. Clinical correlation and vascular surgery consult is advised. The origins of the celiac axis, SMA, IMA as well as the origins of the renal arteries appear patent. There is a standard celiac axis branching anatomy. There are aneurysmal dilatation of the common iliac arteries measuring approximately 2.5 cm in diameter. The external and internal iliac arteries appear patent. No portal venous gas identified. There is no adenopathy. The abdominal wall soft tissues appear unremarkable. There is mild degenerative changes of the spine. No acute fracture. Review of the MIP images confirms the above findings. IMPRESSION: No CT evidence of thoracic aortic aneurysm or dissection. Fusiform infrarenal abdominal aortic aneurysm measuring up to 4.3 cm in axial diameter and increased since the study dated 2014 as well as aneurysmal dilatation of the common iliac arteries. No active extravasation of contrast identified. However, there is diffusely  thickened appearance of the wall of the aorta with infiltrated appearance of the periaortic fat concerning for mural hemorrhage or mild periaortic leak. An impending rupture is not excluded. Correlation with clinical exam and vascular surgery consult is advised. 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 These results were called by telephone at the time of interpretation on 12/18/2015 at 12:20 am to Dr. Mattie Marlin , who verbally acknowledged these results. Electronically Signed: By: Elgie Collard M.D. On: 12/18/2015 00:22   US Abdomen Limited Ruq  Result Date: 12/18/2015 CLINICAL DATA:  Acute onset of epigastric abdominal pain and constipation. Initial encounter. EXAM: US ABDOMEN LIMITED - RIGHT UPPER QUADRANT COMPARISON:  CT of the abdomen and pelvis from 11/15/2012 FINDINGS: Gallbladder: No gallstones or wall thickening visualized. No sonographic Murphy sign noted by sonographer. Common bile duct: Diameter: 0.4 cm, within normal limits in caliber. Liver: No focal lesion identified. Mildly heterogeneous parenchymal echogenicity may reflect fatty infiltration. IMPRESSION: 1. No acute abnormality seen at the right upper quadrant. 2. Suggestion of mild fatty infiltration within the liver. Electronically Signed   By: Roanna Raider M.D.   On: 12/18/2015 03:48   Anti-infectives: Anti-infectives    None      Assessment/Plan: s/p * No surgery found * Right upper quadrant abdominal pain with 1-2 month history of weight loss and general malaise. No specific diagnosis. No suggestion of symptoms related to her aneurysm. Will follow with you.   LOS: 0 days   Jaymian Bogart 12/18/2015, 7:00 AM

## 2015-12-18 NOTE — H&P (Signed)
History and Physical    Briana Hernandez FYB:[id number] DOB: 1950/12/28 DOA: 12/17/2015   PCP: Dennis Bast, MD Chief Complaint:  Chief Complaint  Patient presents with  . Chest Pain    HPI: Briana Hernandez is a 65 y.o. female with medical history significant of AAA which is known to be 4.3 cm in size, GERD, h/o colon polyps, HTN.  Patient presents to the ED with c/o upper abdominal discomfort.  Pain is located in epigastric area, is episodic with "spasms" of pain.  These have been very short episodes, sharp, and do not radiate anywhere.  Alka-seltzer and gas-x provide no relief.  Symptoms onset on Friday morning.  No N/V/D, does endorse some constipation she thinks.  No association with eating.  No dysuria.  ED Course: CTA of chest/abd/pelvis: read is blow but basically the major finding is stranding about the 4.3cm AAA.  See extensive radiologist read below.  Review of Systems: As per HPI otherwise 10 point review of systems negative.    Past Medical History:  Diagnosis Date  . AAA (abdominal aortic aneurysm) (HCC)   . GERD (gastroesophageal reflux disease)   . Hidradenitis suppurativa 12/21/2011  . History of colon polyps 04/10/11?   [facility]--Dr Noe Gens  . Hyperlipidemia   . Hypertension   . Microscopic hematuria    had neg cystoscopy  1/08 Dr Sabino Gasser  . OSA (obstructive sleep apnea) 05/21/2012    Past Surgical History:  Procedure Laterality Date  . TUBAL LIGATION  1977     reports that she quit smoking about 3 years ago. Her smoking use included Cigarettes. She has a 35.00 pack-year smoking history. She has never used smokeless tobacco. She reports that she drinks about 7.2 oz of alcohol per week . Her drug history is not on file.  No Known Allergies  Family History  Problem Relation Age of Onset  . Diabetes Son     type I  . Leukemia Maternal Aunt   . Stroke Maternal Grandmother   . Cancer Cousin     lung?  . Heart disease Maternal Uncle   .  Hypertension Maternal Uncle   . Diabetes Maternal Uncle       Prior to Admission medications   Medication Sig Start Date End Date Taking? Authorizing Provider  aspirin-sod bicarb-citric acid (ALKA-SELTZER) 325 MG TBEF tablet Take 325 mg by mouth every 6 (six) hours as needed (for acid reflux).   Yes Historical Provider, MD  atorvastatin (LIPITOR) 80 MG tablet Take 1 tablet (80 mg total) by mouth daily. 12/21/11  Yes Sandford Craze, NP  benazepril (LOTENSIN) 40 MG tablet Take 40 mg by mouth 2 (two) times daily. 08/29/15  Yes Historical Provider, MD  carvedilol (COREG) 25 MG tablet Take 12.5 mg by mouth 2 (two) times daily with a meal.   Yes Historical Provider, MD  escitalopram (LEXAPRO) 10 MG tablet Take 10 mg by mouth every morning. 11/23/15 02/21/16 Yes Historical Provider, MD  ferrous sulfate 325 (65 FE) MG EC tablet Take 325 mg by mouth 2 (two) times daily. 12/09/15 01/08/16 Yes Historical Provider, MD  pantoprazole (PROTONIX) 40 MG tablet Take 40 mg by mouth 2 (two) times daily. 07/28/15  Yes Historical Provider, MD  Potassium Chloride ER 20 MEQ TBCR Take 20 mEq by mouth 2 (two) times daily. 12/07/15  Yes Historical Provider, MD    Physical Exam: Vitals:   12/18/15 0030 12/18/15 0100 12/18/15 0130 12/18/15 0240  BP: 162/71 185/79 (!) 175/116 (!) 180/101  Pulse: Marland Kitchen)  58 61 (!) 59 (!) 55  Resp: 19 18 20 20   Temp:      TempSrc:      SpO2: 94% 97% 97% 97%      Constitutional: NAD, calm, comfortable Eyes: PERRL, lids and conjunctivae normal ENMT: Mucous membranes are moist. Posterior pharynx clear of any exudate or lesions.Normal dentition.  Neck: normal, supple, no masses, no thyromegaly Respiratory: clear to auscultation bilaterally, no wheezing, no crackles. Normal respiratory effort. No accessory muscle use.  Cardiovascular: Regular rate and rhythm, no murmurs / rubs / gallops. No extremity edema. 2+ pedal pulses. No carotid bruits.  Abdomen: no tenderness, no masses palpated. No  hepatosplenomegaly. Bowel sounds positive.  Musculoskeletal: no clubbing / cyanosis. No joint deformity upper and lower extremities. Good ROM, no contractures. Normal muscle tone.  Skin: no rashes, lesions, ulcers. No induration Neurologic: CN 2-12 grossly intact. Sensation intact, DTR normal. Strength 5/5 in all 4.  Psychiatric: Normal judgment and insight. Alert and oriented x 3. Normal mood.    Labs on Admission: I have personally reviewed following labs and imaging studies  CBC:  Recent Labs Lab 12/17/15 2013  WBC 7.8  HGB 12.7  HCT 38.5  MCV 80.9  PLT 419*   Basic Metabolic Panel:  Recent Labs Lab 12/17/15 2013  NA 138  K 3.3*  CL 104  CO2 26  GLUCOSE 146*  BUN <5*  CREATININE 0.65  CALCIUM 9.4   GFR: CrCl cannot be calculated (Unknown ideal weight.). Liver Function Tests:  Recent Labs Lab 12/17/15 2223  AST 28  ALT 14  ALKPHOS 59  BILITOT 0.4  PROT 6.9  ALBUMIN 3.1*    Recent Labs Lab 12/17/15 2223  LIPASE 37   No results for input(s): AMMONIA in the last 168 hours. Coagulation Profile: No results for input(s): INR, PROTIME in the last 168 hours. Cardiac Enzymes: No results for input(s): CKTOTAL, CKMB, CKMBINDEX, TROPONINI in the last 168 hours. BNP (last 3 results) No results for input(s): PROBNP in the last 8760 hours. HbA1C: No results for input(s): HGBA1C in the last 72 hours. CBG: No results for input(s): GLUCAP in the last 168 hours. Lipid Profile: No results for input(s): CHOL, HDL, LDLCALC, TRIG, CHOLHDL, LDLDIRECT in the last 72 hours. Thyroid Function Tests: No results for input(s): TSH, T4TOTAL, FREET4, T3FREE, THYROIDAB in the last 72 hours. Anemia Panel: No results for input(s): VITAMINB12, FOLATE, FERRITIN, TIBC, IRON, RETICCTPCT in the last 72 hours. Urine analysis:    Component Value Date/Time   COLORURINE YELLOW 11/15/2012 2212   APPEARANCEUR CLEAR 11/15/2012 2212   LABSPEC 1.025 11/15/2012 2212   PHURINE 5.5  11/15/2012 2212   GLUCOSEU NEGATIVE 11/15/2012 2212   HGBUR LARGE (A) 11/15/2012 2212   BILIRUBINUR NEGATIVE 11/15/2012 2212   BILIRUBINUR negative 05/21/2012 1151   KETONESUR NEGATIVE 11/15/2012 2212   PROTEINUR NEGATIVE 11/15/2012 2212   UROBILINOGEN 0.2 11/15/2012 2212   NITRITE NEGATIVE 11/15/2012 2212   LEUKOCYTESUR NEGATIVE 11/15/2012 2212   Sepsis Labs: @LABRCNTIP (procalcitonin:4,lacticidven:4) )No results found for this or any previous visit (from the past 240 hour(s)).   Radiological Exams on Admission: Dg Chest 2 View  Result Date: 12/17/2015 CLINICAL DATA:  Pain EXAM: CHEST  2 VIEW COMPARISON:  12/07/2015 FINDINGS: The heart size and mediastinal contours are within normal limits. Both lungs are clear. The visualized skeletal structures are unremarkable. IMPRESSION: No active cardiopulmonary disease. Electronically Signed   By: Signa Kell M.D.   On: 12/17/2015 21:15   Ct Angio Chest/abd/pel For Dissection  W And/or Wo Contrast  Addendum Date: 12/18/2015   ADDENDUM REPORT: 12/18/2015 00:42 ADDENDUM: Additional comparison is made to the CT of the abdomen pelvis dated 12/15/2015. Indistinct aortic wall and periaortic fat is similar to the study dated 12/15/2015 and may be related to chronic inflammation or scarring or represent a more acute process. Clinical correlation is recommended. No definite active extravasation of contrast identified. Electronically Signed   By: Elgie Collard M.D.   On: 12/18/2015 00:42   Result Date: 12/18/2015 CLINICAL DATA:  65 year old female with epigastric radiating to the back. EXAM: CT ANGIOGRAPHY CHEST, ABDOMEN AND PELVIS TECHNIQUE: Multidetector CT imaging through the chest, abdomen and pelvis was performed using the standard protocol during bolus administration of intravenous contrast. Multiplanar reconstructed images and MIPs were obtained and reviewed to evaluate the vascular anatomy. CONTRAST:  Its 100 cc Isovue 370 COMPARISON:  Chest CT dated  03/11/2015 and abdominal CT dated 11/15/2012 FINDINGS: CTA CHEST FINDINGS There is mild centrilobular and paraseptal emphysema. The lungs are clear. There is no pleural effusion or pneumothorax. The central airways are patent. There is mild atherosclerotic calcification of thoracic aorta. There is no aneurysmal dilatation or evidence of dissection. The origins of the great vessels of the aortic arch appear patent. There is no CT evidence of pulmonary embolism. A top-normal cardiac size. There is coronary vascular calcification. There is no pericardial effusion. Multiple top-normal mediastinal lymph node in the perivascular space, cardiopulmonary window, and right paratracheal region of indeterminate clinical significance. There is no hilar adenopathy. The esophagus is grossly unremarkable. No thyroid nodules identified. There is no axillary adenopathy. The chest wall soft tissues appear unremarkable. There is osteopenia with degenerative changes of spine. No acute fracture. Review of the MIP images confirms the above findings. CTA ABDOMEN AND PELVIS FINDINGS No intra-abdominal free air. Small hypoattenuating area in the right posterior hemipelvis may represent ovarian tissue versus trace free fluid. The liver is, gallbladder, pancreas, spleen, and adrenal glands appear unremarkable. The kidneys, visualized ureters, and urinary bladder appear unremarkable. There is apparent diffuse thickening of the bladder wall which may be partly related to underdistention. Cystitis is not excluded. Correlation with urinalysis recommended. There is redundancy of the sigmoid colon in. There is no evidence of bowel obstruction or active inflammation. Normal appendix. There is advanced aortoiliac atherosclerotic disease. There are diffuse noncalcified plaques versus less likely mural hematoma along the course of the abdominal aorta and common iliac arteries. There is no aortic dissection. There is a fusiform infrarenal abdominal  aortic aneurysm measuring up to 4.3 cm in greatest transverse axial diameter, increased in size since the prior CT of 11/15/2012 where it measured approximately 3.7 cm. There is partial thrombosis of the aneurysmal lumen chest above the bifurcation of the aorta. There is no extravasation of contrast or evidence of active hemorrhage. However, there is diffuse thickened appearance of the aortic wall and aneurysmal sac with infiltration of the immediate periaortic fat. Therefore mild intramural hemorrhage or mild aortic leak with potential for impending rupture is not excluded. Clinical correlation and vascular surgery consult is advised. The origins of the celiac axis, SMA, IMA as well as the origins of the renal arteries appear patent. There is a standard celiac axis branching anatomy. There are aneurysmal dilatation of the common iliac arteries measuring approximately 2.5 cm in diameter. The external and internal iliac arteries appear patent. No portal venous gas identified. There is no adenopathy. The abdominal wall soft tissues appear unremarkable. There is mild degenerative changes of the  spine. No acute fracture. Review of the MIP images confirms the above findings. IMPRESSION: No CT evidence of thoracic aortic aneurysm or dissection. Fusiform infrarenal abdominal aortic aneurysm measuring up to 4.3 cm in axial diameter and increased since the study dated 2014 as well as aneurysmal dilatation of the common iliac arteries. No active extravasation of contrast identified. However, there is diffusely thickened appearance of the wall of the aorta with infiltrated appearance of the periaortic fat concerning for mural hemorrhage or mild periaortic leak. An impending rupture is not excluded. Correlation with clinical exam and vascular surgery consult is advised. 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 These results were called by telephone at the time of interpretation on  12/18/2015 at 12:20 am to Dr. Mattie MarlinJESSICA FOCHT , who verbally acknowledged these results. Electronically Signed: By: Elgie CollardArash  Radparvar M.D. On: 12/18/2015 00:22    EKG: Independently reviewed.  Assessment/Plan Principal Problem:   Intractable abdominal pain Active Problems:   HTN (hypertension)   GERD (gastroesophageal reflux disease)   AAA (abdominal aortic aneurysm) (HCC)    1. Intractable abdominal pain in patient with known AAA - 1. CT scan is worrisome for diffuse haziness about the AAA which could suggest inflammatory aneurysm. 2. Remainder of CT is unremarkable 3. labs including LFTs, lipase, CBC and BMP are unremarkable 4. Constipation is considered, will put her on scheduled miralax; however, stool burden is not noted to be particularly large on the CT scan 5. Given history of GERD, may wish to consider GI consult and eval in AM regarding diagnostic upper EGD to rule out ulcer or esophageal problem. 6. Will get RUQ US, but gall bladder, liver, pancreas are unremarkable on CT scan and enzymes / lipase are all WNL. 7. Will try PRN GI cocktails to see if this helps at all. 8. Will get serial troponins 9. Tele monitor 10. No anticoagulants 11. NPO 2. HTN - 1. Given the worrisome looking aneurysm as above, will try to be more aggressive with BP goals.  Hydralazine PRN ordered with BP goals of less than 160 at least. 3. Unintentional weight loss - 1. Unclear cause of this, CTA chest/abd/pevlis is negative for malignancy or other obvious process that would be responsible. 2. Again, consider GI consult as above   DVT prophylaxis: SCDs only Code Status: Full Family Communication: Daughter at bedside Consults called: Dr. Arbie CookeyEarly has seen patient, note is in chart Admission status: Admit to inpatient   Hillary BowGARDNER, JARED M. DO [facility] Pager [phone] from 7PM-7AM  If 7AM-7PM, please contact the day physician for the patient [email or web address] Password [email or web address]  12/18/2015, 2:56 AM

## 2015-12-18 NOTE — ED Notes (Signed)
Dr. Early at bedside 

## 2015-12-18 NOTE — ED Provider Notes (Signed)
Pt seen/examined Currently stable Seen by vascular, no need for emergent surgery but due to intractable pain admission recommended D/w dr gardner for admission    Briana Rhineonald [name], MD 12/18/15 [phone]

## 2015-12-18 NOTE — Consult Note (Signed)
Subjective:   HPI  The patient is a 65 year old female who we are asked to see in regards to epigastric abdominal pain. She points to the epigastrium and slightly right of the epigastrium as the site of her pain. She has been experiencing this pain for the last few days. It comes on suddenly and is a sharp pain and then will go away. She does have a history of an abdominal aortic aneurysm and is being [facility] with yearly ultrasounds. She has been seen in consultation here by Dr. early who is not convinced that her pain is related to the abdominal aortic aneurysm. She states that she had an EGD and colonoscopy in [place] in June of this year. When I asked her why this was done she was having some heartburn, and that's why the EGD was done, she also said they found some polyps. We do not have those reports. The patient is not complaining of heartburn at this time. She has been on pantoprazole. We are asked to see her to evaluate the upper abdominal pain again. She still has her gallbladder. This pain does not seem to occur postprandially. It seems to just occur randomly.  Review of Systems No chest pain or shortness of breath  Past Medical History:  Diagnosis Date  . AAA (abdominal aortic aneurysm) (HCC)   . GERD (gastroesophageal reflux disease)   . Hidradenitis suppurativa 12/21/2011  . History of colon polyps 04/10/11?   [facility]--Dr Noe Gens  . Hyperlipidemia   . Hypertension   . Microscopic hematuria    had neg cystoscopy  1/08 Dr Sabino Gasser  . OSA (obstructive sleep apnea) 05/21/2012   Past Surgical History:  Procedure Laterality Date  . TUBAL LIGATION  1977   Social History   Social History  . Marital status: Married    Spouse name: N/A  . Number of children: 4  . Years of education: N/A   Occupational History  . Not on file.   Social History Main Topics  . Smoking status: Former Smoker    Packs/day: 1.00   Years: 35.00    Types: Cigarettes    Quit date: 01/29/2012  . Smokeless tobacco: Never Used  . Alcohol use 7.2 oz/week    12 Cans of beer per week  . Drug use: Unknown  . Sexual activity: Not on file   Other Topics Concern  . 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   Married   Enjoys crosswords/television   family history includes Cancer in her cousin; Diabetes in her maternal uncle and son; Heart disease in her maternal uncle; Hypertension in her maternal uncle; Leukemia in her maternal aunt; Stroke in her maternal grandmother.  Current Facility-Administered Medications:  .  acetaminophen (TYLENOL) tablet 650 mg, 650 mg, Oral, Q6H PRN, Narda Bonds, MD .  atorvastatin (LIPITOR) tablet 80 mg, 80 mg, Oral, Daily, Jared M Gardner, DO .  benazepril (LOTENSIN) tablet 40 mg, 40 mg, Oral, BID, Hillary Bow, DO, 40 mg at 12/18/15 1610 .  carvedilol (COREG) tablet 12.5 mg, 12.5 mg, Oral, BID WC, Hillary Bow, DO, 12.5 mg at 12/18/15 0742 .  docusate sodium (COLACE) capsule 100 mg, 100 mg, Oral, BID, Hillary Bow, DO, 100 mg at 12/18/15 9604 .  escitalopram (LEXAPRO) tablet  10 mg, 10 mg, Oral, Daily, Hillary BowJared M Gardner, DO, 10 mg at 12/18/15 [id number] .  ferrous sulfate tablet 325 mg, 325 mg, Oral, BID, Hillary BowJared M Gardner, DO, 325 mg at 12/18/15 [id number] .  gi cocktail (Maalox,Lidocaine,Donnatal), 30 mL, Oral, TID PRN, Hillary BowJared M Gardner, DO, 30 mL at 12/18/15 0419 .  hydrALAZINE (APRESOLINE) injection 10-20 mg, 10-20 mg, Intravenous, Q4H PRN, Hillary BowJared M Gardner, DO, 20 mg at 12/18/15 0501 .  pantoprazole (PROTONIX) EC tablet 40 mg, 40 mg, Oral, BID, Hillary BowJared M Gardner, DO, 40 mg at 12/18/15 [id number] .  [START ON 12/19/2015] pneumococcal 23 valent vaccine (PNU-IMMUNE) injection 0.5 mL, 0.5 mL, Intramuscular, Tomorrow-1000, Jared M Gardner, DO .  potassium chloride SA (K-DUR,KLOR-CON) CR  tablet 20 mEq, 20 mEq, Oral, BID, Narda Bondsalph A Nettey, MD, 20 mEq at 12/18/15 [id number] .  sodium chloride flush (NS) 0.9 % injection 3 mL, 3 mL, Intravenous, Q12H, Jared M Gardner, DO, 3 mL at 12/18/15 1000 No Known Allergies   Objective:     BP (!) 149/58   Pulse (!) 55   Temp 97.7 F (36.5 C) (Oral)   Resp 16   Ht 5\' 4"  (1.626 m)   Wt 81 kg (178 lb 9.2 oz)   SpO2 96%   BMI 30.65 kg/m   She is in no distress  Nonicteric  Heart regular rhythm no murmurs  Lungs clear  Abdomen: Bowel sounds normal, soft, there is some tenderness in the epigastrium but no rebound or guarding no obvious hepatosplenomegaly  Laboratory No components found for: D1    Assessment:     Epigastric pain etiology unclear  Abdominal aortic aneurysm      Plan:     We will proceed with EGD tomorrow to see if there is anything going on in the upper GI tract which might explain the pain. If this is negative consider evaluation of gallbladder. If all of these are negative and no definite GI source of pain is found reconsider the possibility of the aneurysm causing the pain.

## 2015-12-18 NOTE — ED Notes (Signed)
[place]ported to [place].

## 2015-12-18 NOTE — Progress Notes (Signed)
PROGRESS NOTE    Briana Hernandez  ZOX:[id number] DOB: 05-25-1950 DOA: 12/17/2015 PCP: Dennis Bast, MD   Brief Narrative: Briana Hernandez is a 65 y.o.    Assessment & Plan:   Principal Problem:   Intractable abdominal pain Active Problems:   HTN (hypertension)   GERD (gastroesophageal reflux disease)   AAA (abdominal aortic aneurysm) (HCC)   Intractable abdominal patient Abdominal aortic aneurism GERD Initial evaluation does not reveal a clear reason for abdominal pain. Does not appear to be related to the pancreas, liver and gallbladder. Abdominal aortic aneurysm appears stable from previous CT scan 2 days prior. Vascular surgery is currently on board and feels this is secondary to some other process. Patient does have a history of GI pathology. She has been followed by [facility] in [facility]. She is currently stable and pain is somewhat improved. GI cocktail helped minimally. -Currently has a diet order, will leave this as a sense EGD will likely not occur today -Consult GI for upper endoscopy to rule out gastric reason for symptoms. To see her today, but likely EGD tomorrow -Keep in stepdown today  Hypertension Slightly uncontrolled. Goal with aneurysm is less than 160 -Continue Coreg twice a day -Continue been benazepril -Continue hydralazine when necessary  Unintentional weight loss Patient had recent EGD and for stricture. CTA chest abdomen and pelvis negative for malignancy   DVT prophylaxis: SCDs Code Status: Full code  Family Communication: None at bedside Disposition Plan: Expect discharge home in 2 days   Consultants:   Vascular surgery  Gastroenterology  Procedures:   None  Antimicrobials:  None    Subjective: Patient reports having onset of pain early this morning.  Objective: Vitals:   12/18/15 0300 12/18/15 0400 12/18/15 0440 12/18/15 0600  BP: 150/63 158/76 (!) 175/88 128/60  Pulse: (!) 55 (!) 57 (!) 53 (!) 57  Resp: 19 13 13  17   Temp:   97.7 F (36.5 C)   TempSrc:   Oral   SpO2: 95% 94% 97% 100%  Weight:   81 kg (178 lb 9.2 oz)   Height:   5\' 4"  (1.626 m)    No intake or output data in the 24 hours ending 12/18/15 0819 Filed Weights   12/18/15 0440  Weight: 81 kg (178 lb 9.2 oz)    Examination:  General exam: Appears calm and comfortable. No distress Respiratory system: Clear to auscultation. Respiratory effort normal. Cardiovascular system: S1 & S2 heard, RRR. No murmurs, rubs, gallops or clicks. Gastrointestinal system: Abdomen is nondistended, soft and slight tenderness epigastric, right upper quadrant and left upper quadrant. Normal bowel sounds heard. Central nervous system: Alert and oriented. No focal neurological deficits. Extremities: No cyanosis. No edema Skin: No rashes, lesions or ulcers Psychiatry: Judgement and insight appear normal. Mood & affect appropriate.     Data Reviewed: I have personally reviewed following labs and imaging studies  CBC:  Recent Labs Lab 12/17/15 2013  WBC 7.8  HGB 12.7  HCT 38.5  MCV 80.9  PLT 419*   Basic Metabolic Panel:  Recent Labs Lab 12/17/15 2013  NA 138  K 3.3*  CL 104  CO2 26  GLUCOSE 146*  BUN <5*  CREATININE 0.65  CALCIUM 9.4   GFR: Estimated Creatinine Clearance (by C-G formula based on SCr of 0.8 mg/dL) Female: 81.1 mL/min Female: 88.4 mL/min Liver Function Tests:  Recent Labs Lab 12/17/15 2223  AST 28  ALT 14  ALKPHOS 59  BILITOT 0.4  PROT 6.9  ALBUMIN 3.1*    Recent Labs Lab 12/17/15 2223  LIPASE 37   No results for input(s): AMMONIA in the last 168 hours. Coagulation Profile: No results for input(s): INR, PROTIME in the last 168 hours. Cardiac Enzymes:  Recent Labs Lab 12/18/15 0517  TROPONINI <0.03  0.03*   BNP (last 3 results) No results for input(s): PROBNP in the last 8760 hours. HbA1C: No results for input(s): HGBA1C in the last 72 hours. CBG: No results for input(s): GLUCAP in the last  168 hours. Lipid Profile: No results for input(s): CHOL, HDL, LDLCALC, TRIG, CHOLHDL, LDLDIRECT in the last 72 hours. Thyroid Function Tests: No results for input(s): TSH, T4TOTAL, FREET4, T3FREE, THYROIDAB in the last 72 hours. Anemia Panel: No results for input(s): VITAMINB12, FOLATE, FERRITIN, TIBC, IRON, RETICCTPCT in the last 72 hours. Sepsis Labs: No results for input(s): PROCALCITON, LATICACIDVEN in the last 168 hours.  No results found for this or any previous visit (from the past 240 hour(s)).       Radiology Studies: Dg Chest 2 View  Result Date: 12/17/2015 CLINICAL DATA:  Pain EXAM: CHEST  2 VIEW COMPARISON:  12/07/2015 FINDINGS: The heart size and mediastinal contours are within normal limits. Both lungs are clear. The visualized skeletal structures are unremarkable. IMPRESSION: No active cardiopulmonary disease. Electronically Signed   By: Signa Kell M.D.   On: 12/17/2015 21:15   Ct Angio Chest/abd/pel For Dissection W And/or Wo Contrast  Addendum Date: 12/18/2015   ADDENDUM REPORT: 12/18/2015 00:42 ADDENDUM: Additional comparison is made to the CT of the abdomen pelvis dated 12/15/2015. Indistinct aortic wall and periaortic fat is similar to the study dated 12/15/2015 and may be related to chronic inflammation or scarring or represent a more acute process. Clinical correlation is recommended. No definite active extravasation of contrast identified. Electronically Signed   By: Elgie Collard M.D.   On: 12/18/2015 00:42   Result Date: 12/18/2015 CLINICAL DATA:  65 year old female with epigastric radiating to the back. EXAM: CT ANGIOGRAPHY CHEST, ABDOMEN AND PELVIS TECHNIQUE: Multidetector CT imaging through the chest, abdomen and pelvis was performed using the standard protocol during bolus administration of intravenous contrast. Multiplanar reconstructed images and MIPs were obtained and reviewed to evaluate the vascular anatomy. CONTRAST:  Its 100 cc Isovue 370 COMPARISON:   Chest CT dated 03/11/2015 and abdominal CT dated 11/15/2012 FINDINGS: CTA CHEST FINDINGS There is mild centrilobular and paraseptal emphysema. The lungs are clear. There is no pleural effusion or pneumothorax. The central airways are patent. There is mild atherosclerotic calcification of thoracic aorta. There is no aneurysmal dilatation or evidence of dissection. The origins of the great vessels of the aortic arch appear patent. There is no CT evidence of pulmonary embolism. A top-normal cardiac size. There is coronary vascular calcification. There is no pericardial effusion. Multiple top-normal mediastinal lymph node in the perivascular space, cardiopulmonary window, and right paratracheal region of indeterminate clinical significance. There is no hilar adenopathy. The esophagus is grossly unremarkable. No thyroid nodules identified. There is no axillary adenopathy. The chest wall soft tissues appear unremarkable. There is osteopenia with degenerative changes of spine. No acute fracture. Review of the MIP images confirms the above findings. CTA ABDOMEN AND PELVIS FINDINGS No intra-abdominal free air. Small hypoattenuating area in the right posterior hemipelvis may represent ovarian tissue versus trace free fluid. The liver is, gallbladder, pancreas, spleen, and adrenal glands appear unremarkable. The kidneys, visualized ureters, and urinary bladder appear unremarkable. There is apparent diffuse thickening of the bladder wall  which may be partly related to underdistention. Cystitis is not excluded. Correlation with urinalysis recommended. There is redundancy of the sigmoid colon in. There is no evidence of bowel obstruction or active inflammation. Normal appendix. There is advanced aortoiliac atherosclerotic disease. There are diffuse noncalcified plaques versus less likely mural hematoma along the course of the abdominal aorta and common iliac arteries. There is no aortic dissection. There is a fusiform infrarenal  abdominal aortic aneurysm measuring up to 4.3 cm in greatest transverse axial diameter, increased in size since the prior CT of 11/15/2012 where it measured approximately 3.7 cm. There is partial thrombosis of the aneurysmal lumen chest above the bifurcation of the aorta. There is no extravasation of contrast or evidence of active hemorrhage. However, there is diffuse thickened appearance of the aortic wall and aneurysmal sac with infiltration of the immediate periaortic fat. Therefore mild intramural hemorrhage or mild aortic leak with potential for impending rupture is not excluded. Clinical correlation and vascular surgery consult is advised. The origins of the celiac axis, SMA, IMA as well as the origins of the renal arteries appear patent. There is a standard celiac axis branching anatomy. There are aneurysmal dilatation of the common iliac arteries measuring approximately 2.5 cm in diameter. The external and internal iliac arteries appear patent. No portal venous gas identified. There is no adenopathy. The abdominal wall soft tissues appear unremarkable. There is mild degenerative changes of the spine. No acute fracture. Review of the MIP images confirms the above findings. IMPRESSION: No CT evidence of thoracic aortic aneurysm or dissection. Fusiform infrarenal abdominal aortic aneurysm measuring up to 4.3 cm in axial diameter and increased since the study dated 2014 as well as aneurysmal dilatation of the common iliac arteries. No active extravasation of contrast identified. However, there is diffusely thickened appearance of the wall of the aorta with infiltrated appearance of the periaortic fat concerning for mural hemorrhage or mild periaortic leak. An impending rupture is not excluded. Correlation with clinical exam and vascular surgery consult is advised. 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 These results were called by telephone at the time of  interpretation on 12/18/2015 at 12:20 am to Dr. Mattie MarlinJESSICA FOCHT , who verbally acknowledged these results. Electronically Signed: By: Elgie CollardArash  Radparvar M.D. On: 12/18/2015 00:22   [place] Abdomen Limited Ruq  Result Date: 12/18/2015 CLINICAL DATA:  Acute onset of epigastric abdominal pain and constipation. Initial encounter. EXAM: US ABDOMEN LIMITED - RIGHT UPPER QUADRANT COMPARISON:  CT of the abdomen and pelvis from 11/15/2012 FINDINGS: Gallbladder: No gallstones or wall thickening visualized. No sonographic Murphy sign noted by sonographer. Common bile duct: Diameter: 0.4 cm, within normal limits in caliber. Liver: No focal lesion identified. Mildly heterogeneous parenchymal echogenicity may reflect fatty infiltration. IMPRESSION: 1. No acute abnormality seen at the right upper quadrant. 2. Suggestion of mild fatty infiltration within the liver. Electronically Signed   By: Roanna RaiderJeffery  Chang M.D.   On: 12/18/2015 03:48        Scheduled Meds: . atorvastatin  80 mg Oral Daily  . benazepril  40 mg Oral BID  . carvedilol  12.5 mg Oral BID WC  . docusate sodium  100 mg Oral BID  . escitalopram  10 mg Oral Daily  . ferrous sulfate  325 mg Oral BID  . pantoprazole  40 mg Oral BID  . [START ON 12/19/2015] pneumococcal 23 valent vaccine  0.5 mL Intramuscular Tomorrow-1000  . potassium chloride SA  20 mEq Oral BID  .  sodium chloride flush  3 mL Intravenous Q12H   Continuous Infusions:    LOS: 0 days     Jacquelin Hawking, MD [facility] 12/18/2015, 8:19 AM Pager: [phone]  If 7PM-7AM, please contact night-coverage [email or web address] Password [email or web address] 12/18/2015, 8:19 AM

## 2015-12-18 NOTE — Consult Note (Signed)
[facility] [place]  Patient name: Briana Hernandez MRN: [id number] DOB: 07-02-1950 Sex: female  REASON FOR CONSULT: Abdominal pain with known abdominal aortic aneurysm  HPI: Briana Hernandez is a 65 y.o. female, who is presents to the emergency room with upper abdominal discomfort. She has a known history abdominal aortic aneurysm and is [facility] with yearly ultrasounds. She has had a 1-2 month history of unexplained weight loss and weakness he reports change in her bowel habit with the constipation. Reports no energy. Had a CT scan 2 days ago for evaluation of this. This revealed the known aneurysm with no obvious cause for for weight loss and weakness. This evening she began having episodes of substernal pain and upper epigastric pain. She presented to the emergency room for further evaluation. She had the CT scan today with comparison from 2 days ago there is been no change.  Past Medical History:  Diagnosis Date  . AAA (abdominal aortic aneurysm) (HCC)   . GERD (gastroesophageal reflux disease)   . Hidradenitis suppurativa 12/21/2011  . History of colon polyps 04/10/11?   [facility]--Dr Noe Gens  . Hyperlipidemia   . Hypertension   . Microscopic hematuria    had neg cystoscopy  1/08 Dr Sabino Gasser  . OSA (obstructive sleep apnea) 05/21/2012    Family History  Problem Relation Age of Onset  . Diabetes Son     type I  . Leukemia Maternal Aunt   . Stroke Maternal Grandmother   . Cancer Cousin     lung?  . Heart disease Maternal Uncle   . Hypertension Maternal Uncle   . Diabetes Maternal Uncle     SOCIAL HISTORY: Social History   Social History  . Marital status: Married    Spouse name: N/A  . Number of children: 4  . Years of education: N/A   Occupational History  . Not on file.   Social History Main Topics  . Smoking status: Former Smoker    Packs/day: 1.00   Years: 35.00    Types: Cigarettes    Quit date: 01/29/2012  . Smokeless tobacco: Never Used  . Alcohol use 7.2 oz/week    12 Cans of beer per week  . Drug use: Unknown  . Sexual activity: Not on file   Other Topics Concern  . 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   Married   Enjoys crosswords/television    No Known Allergies  No current facility-administered medications for this encounter.    Current Outpatient Prescriptions  Medication Sig Dispense Refill  . aspirin-sod bicarb-citric acid (ALKA-SELTZER) 325 MG TBEF tablet Take 325 mg by mouth every 6 (six) hours as needed (for acid reflux).    Marland Kitchen atorvastatin (LIPITOR) 80 MG tablet Take 1 tablet (80 mg total) by mouth daily. 30 tablet 5  . benazepril (LOTENSIN) 40 MG tablet Take 40 mg by mouth 2 (two) times daily.    . carvedilol (COREG) 25 MG tablet Take 12.5 mg by mouth 2 (two) times daily with a meal.    . escitalopram (LEXAPRO) 10 MG tablet  Take 10 mg by mouth every morning.    . ferrous sulfate 325 (65 FE) MG EC tablet Take 325 mg by mouth 2 (two) times daily.    . pantoprazole (PROTONIX) 40 MG tablet Take 40 mg by mouth 2 (two) times daily.    . Potassium Chloride ER 20 MEQ TBCR Take 20 mEq by mouth 2 (two) times daily.    . ondansetron (ZOFRAN ODT) 4 MG disintegrating tablet 4mg  ODT q4 hours prn nausea/vomit (Patient not taking: Reported on 12/17/2015) 8 tablet 0  . oxyCODONE-acetaminophen (PERCOCET) 5-325 MG per tablet Take 1 tablet by mouth every 4 (four) hours as needed for pain. (Patient not taking: Reported on 12/17/2015) 10 tablet 0    REVIEW OF SYSTEMS:  [X]  denotes positive finding, [ ]  denotes negative finding Cardiac  Comments:  Chest pain or chest pressure:    Shortness of breath upon exertion:    Short of breath when lying flat:    Irregular heart rhythm:        Vascular    Pain in  calf, thigh, or hip brought on by ambulation:    Pain in feet at night that wakes you up from your sleep:     Blood clot in your veins:    Leg swelling:         Pulmonary    Oxygen at home:    Productive cough:     Wheezing:         Neurologic    Sudden weakness in arms or legs:     Sudden numbness in arms or legs:     Sudden onset of difficulty speaking or slurred speech:    Temporary loss of vision in one eye:     Problems with dizziness:         Gastrointestinal    Blood in stool:     Vomited blood:         Genitourinary    Burning when urinating:     Blood in urine:        Psychiatric    Major depression:         Hematologic    Bleeding problems:    Problems with blood clotting too easily:        Skin    Rashes or ulcers:        Constitutional    Fever or chills:      PHYSICAL EXAM: Vitals:   12/17/15 2341 12/18/15 0000 12/18/15 0030 12/18/15 0100  BP: 178/85 166/75 162/71 185/79  Pulse: 62 63 (!) 58 61  Resp: 19 18 19 18   Temp:      TempSrc:      SpO2: 96% 96% 94% 97%    GENERAL: The patient is a well-nourished female, in no acute distress. The vital signs are documented above. CARDIAC: There is a regular rate and rhythm.  VASCULAR: 2+ radial 2+ popliteal and 2+ posterior tibial pulses bilaterally PULMONARY: There is good air exchange ABDOMEN: Soft and non-tender with no aneurysm palpable. MUSCULOSKELETAL: There are no major deformities or cyanosis. NEUROLOGIC: No focal weakness or paresthesias are detected. SKIN: There are no ulcers or rashes noted. PSYCHIATRIC: The patient has a normal affect.  DATA:  CT scan from 12/15/2015 and 12/17/2015 were reviewed and compared. Is been no change in 48 hours. She has no evidence of rupture. She does have the irregular thrombus in her aorta which is unchanged. Her aneurysm does begin just below the renal arteries with irregularity at this  area and it does extend into her iliac arteries bilaterally. 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  MEDICAL ISSUES: Known abdominal aortic aneurysm with current maximal diameter 4.2 cm no evidence of leak. I discussed this at length with the patient and her family present. Agree with plans for admission to medical service for evaluation of her abdominal pain and several month history of weight loss and weakness. Will follow along. Explained that she would require open aneurysm repair with the location of her aneurysm extending to the level of the renal arteries.   Larina Earthlyodd F. Lachlan Mckim, MD FACS [facility] Office Tel [phone] Pager [phone]

## 2015-12-18 NOTE — ED Notes (Signed)
Returned from [facility]

## 2015-12-19 ENCOUNTER — Encounter (HOSPITAL_COMMUNITY): Payer: Self-pay | Admitting: *Deleted

## 2015-12-19 ENCOUNTER — Inpatient Hospital Stay (HOSPITAL_COMMUNITY): Payer: Medicare Other | Admitting: Anesthesiology

## 2015-12-19 ENCOUNTER — Encounter (HOSPITAL_COMMUNITY)
Admission: EM | Disposition: A | Discharge status: Home / Self Care | Payer: Self-pay | Source: Home / Self Care | Attending: Family Medicine

## 2015-12-19 DIAGNOSIS — R109 Unspecified abdominal pain: Secondary | ICD-10-CM | POA: Diagnosis not present

## 2015-12-19 DIAGNOSIS — R1013 Epigastric pain: Secondary | ICD-10-CM | POA: Diagnosis not present

## 2015-12-19 DIAGNOSIS — I10 Essential (primary) hypertension: Secondary | ICD-10-CM | POA: Diagnosis not present

## 2015-12-19 DIAGNOSIS — K660 Peritoneal adhesions (postprocedural) (postinfection): Secondary | ICD-10-CM | POA: Diagnosis not present

## 2015-12-19 DIAGNOSIS — R1011 Right upper quadrant pain: Secondary | ICD-10-CM | POA: Diagnosis not present

## 2015-12-19 DIAGNOSIS — K219 Gastro-esophageal reflux disease without esophagitis: Secondary | ICD-10-CM | POA: Diagnosis not present

## 2015-12-19 DIAGNOSIS — I714 Abdominal aortic aneurysm, without rupture: Secondary | ICD-10-CM | POA: Diagnosis not present

## 2015-12-19 HISTORY — PX: ESOPHAGOGASTRODUODENOSCOPY (EGD) WITH PROPOFOL: SHX5813

## 2015-12-19 LAB — BASIC METABOLIC PANEL
Anion gap: 6 (ref 5–15)
CHLORIDE: 107 mmol/L (ref 101–111)
CO2: 25 mmol/L (ref 22–32)
CREATININE: 0.49 mg/dL (ref 0.44–1.00)
Calcium: 9.1 mg/dL (ref 8.9–10.3)
GFR calc non Af Amer: >60 mL/min (ref >60)
Glucose, Bld: 84 mg/dL (ref 65–99)
POTASSIUM: 3.6 mmol/L (ref 3.5–5.1)
Sodium: 138 mmol/L (ref 135–145)

## 2015-12-19 SURGERY — ESOPHAGOGASTRODUODENOSCOPY (EGD) WITH PROPOFOL
Anesthesia: Monitor Anesthesia Care

## 2015-12-19 MED ORDER — PROPOFOL 10 MG/ML IV BOLUS
INTRAVENOUS | Status: DC | PRN
  Administered 2015-12-19: 100 mg via INTRAVENOUS

## 2015-12-19 MED ORDER — PROPOFOL 500 MG/50ML IV EMUL
INTRAVENOUS | Status: DC | PRN
  Administered 2015-12-19: 80 ug/kg/min via INTRAVENOUS

## 2015-12-19 MED ORDER — HYDROCHLOROTHIAZIDE 12.5 MG PO CAPS
12.5000 mg | ORAL_CAPSULE | Freq: Every day | ORAL | Status: DC
  Administered 2015-12-19 – 2015-12-21 (×3): 12.5 mg via ORAL
  Filled 2015-12-19 (×3): qty 1

## 2015-12-19 MED ORDER — LACTATED RINGERS IV SOLN
INTRAVENOUS | Status: DC
  Administered 2015-12-19: 1000 mL via INTRAVENOUS

## 2015-12-19 NOTE — Progress Notes (Addendum)
  Progress Note    12/19/2015 11:09 AM Hospital Day 2  Subjective:  Still having some abdominal pain; denies any back pain  Afebrile 130's-170's systolic HR 50's-60's 98% RA  Vitals:   12/19/15 0327 12/19/15 0730  BP: (!) 148/91 (!) 177/74  Pulse: 60 (!) 55  Resp: 19 14  Temp: 97.5 F (36.4 C) 98.2 F (36.8 C)    Physical Exam: Abdomen:  Tenderness to palpation right upper and right mid quadrants   CBC    Component Value Date/Time   WBC 7.8 12/17/2015 2013   RBC 4.76 12/17/2015 2013   HGB 12.7 12/17/2015 2013   HCT 38.5 12/17/2015 2013   PLT 419 (H) 12/17/2015 2013   MCV 80.9 12/17/2015 2013   MCH 26.7 12/17/2015 2013   MCHC 33.0 12/17/2015 2013   RDW 15.9 (H) 12/17/2015 2013   LYMPHSABS 4.4 (H) 11/15/2012 2220   MONOABS 0.6 11/15/2012 2220   EOSABS 0.1 11/15/2012 2220   BASOSABS 0.0 11/15/2012 2220    BMET    Component Value Date/Time   NA 138 12/19/2015 0215   K 3.6 12/19/2015 0215   CL 107 12/19/2015 0215   CO2 25 12/19/2015 0215   GLUCOSE 84 12/19/2015 0215   BUN <5 (L) 12/19/2015 0215   CREATININE 0.49 12/19/2015 0215   CREATININE 0.70 12/21/2011 1201   CALCIUM 9.1 12/19/2015 0215   GFRNONAA >60 12/19/2015 0215   GFRAA >60 12/19/2015 0215    INR No results found for: INR   Intake/Output Summary (Last 24 hours) at 12/19/15 1109 Last data filed at 12/19/15 0806  Gross per 24 hour  Intake              218 ml  Output                0 ml  Net              218 ml     Assessment/Plan:  65 y.o. female with know AAA, which measures 4.2cm with no evidence of leak on CT scan Hospital Day 2  -most likely not related to AAA as her sx do not correlate and no leak on CT scan -will have endoscopy today - if this is negative, consideration of gallbladder workup  -will continue to follow   Doreatha MassedSamantha Rhyne, PA-C [facility] [phone] 12/19/2015 11:09 AM   I have examined the patient, reviewed and agree with above.Abdominal  pain continues to be epigastric and right upper quadrant. Mild tenderness this area. No lower abdominal tenderness. Workup continues. Very doubtful this is related to her aneurysm. Following with you.   Gretta BeganEarly, Arva Slaugh, MD 12/19/2015 1:57 PM

## 2015-12-19 NOTE — Progress Notes (Signed)
PROGRESS NOTE    Briana Hernandez  ZOX:[id number] DOB: March 02, 1951 DOA: 12/17/2015 PCP: Dennis Bast, MD   Brief Narrative: Briana Hernandez is a 65 y.o.    Assessment & Plan:   Principal Problem:   Intractable abdominal pain Active Problems:   HTN (hypertension)   GERD (gastroesophageal reflux disease)   AAA (abdominal aortic aneurysm) (HCC)   Intractable abdominal patient Abdominal aortic aneurism GERD Patient still had abdominal pain and is intermittent but persistent. RUQ ultrasound shows no gallbladder pathology. LFTs all within normal limits. EGD scheduled for today. -follow-up EGD -Keep in stepdown today -aggressive BP control  Hypertension Continues to be uncontrolled. Goal with aneurysm is less than 160 -Continue Coreg twice a day -Continue been benazepril -Continue hydralazine when necessary -start hydrochlorothiazide 12.5mg  daily today  Unintentional weight loss Patient had recent EGD and for stricture. CTA chest abdomen and pelvis negative for malignancy. Albumin slightly low   DVT prophylaxis: SCDs Code Status: Full code  Family Communication: None at bedside Disposition Plan: Expect discharge home in 2 days   Consultants:   Vascular surgery  Gastroenterology  Procedures:   None  Antimicrobials:  None    Subjective: Pain still present. No change. No nausea or vomting today  Objective: Vitals:   12/18/15 1918 12/18/15 2332 12/19/15 0327 12/19/15 0730  BP: 133/62 (!) 145/69 (!) 148/91 (!) 177/74  Pulse: 63 (!) 58 60 (!) 55  Resp: 16 14 19 14   Temp: 97.8 F (36.6 C) 97.8 F (36.6 C) 97.5 F (36.4 C) 98.2 F (36.8 C)  TempSrc: Oral Oral Oral Oral  SpO2: 98% 99% 97% 95%  Weight:      Height:        Intake/Output Summary (Last 24 hours) at 12/19/15 0733 Last data filed at 12/19/15 0600  Gross per 24 hour  Intake              176 ml  Output              200 ml  Net              -24 ml   Filed Weights   12/18/15 0440    Weight: 81 kg (178 lb 9.2 oz)    Examination:  General exam: Appears calm and comfortable. No distress Respiratory system: Clear to auscultation. Respiratory effort normal. Cardiovascular system: S1 & S2 heard, RRR. No murmurs, rubs, gallops or clicks. Gastrointestinal system: Abdomen is nondistended, soft and non-tender. Normal bowel sounds heard. Central nervous system: Alert and oriented. No focal neurological deficits. Extremities: No cyanosis. No edema Skin: No rashes, lesions or ulcers Psychiatry: Judgement and insight appear normal. Mood & affect appropriate.     Data Reviewed: I have personally reviewed following labs and imaging studies  CBC:  Recent Labs Lab 12/17/15 2013  WBC 7.8  HGB 12.7  HCT 38.5  MCV 80.9  PLT 419*   Basic Metabolic Panel:  Recent Labs Lab 12/17/15 2013 12/19/15 0215  NA 138 138  K 3.3* 3.6  CL 104 107  CO2 26 25  GLUCOSE 146* 84  BUN <5* <5*  CREATININE 0.65 0.49  CALCIUM 9.4 9.1   GFR: Estimated Creatinine Clearance (by C-G formula based on SCr of 0.8 mg/dL) Female: 81.1 mL/min Female: 88.4 mL/min Liver Function Tests:  Recent Labs Lab 12/17/15 2223  AST 28  ALT 14  ALKPHOS 59  BILITOT 0.4  PROT 6.9  ALBUMIN 3.1*    Recent Labs Lab 12/17/15 2223  LIPASE 37  No results for input(s): AMMONIA in the last 168 hours. Coagulation Profile: No results for input(s): INR, PROTIME in the last 168 hours. Cardiac Enzymes:  Recent Labs Lab 12/18/15 0517 12/18/15 1458  TROPONINI <0.03  0.03* <0.03   BNP (last 3 results) No results for input(s): PROBNP in the last 8760 hours. HbA1C: No results for input(s): HGBA1C in the last 72 hours. CBG: No results for input(s): GLUCAP in the last 168 hours. Lipid Profile: No results for input(s): CHOL, HDL, LDLCALC, TRIG, CHOLHDL, LDLDIRECT in the last 72 hours. Thyroid Function Tests: No results for input(s): TSH, T4TOTAL, FREET4, T3FREE, THYROIDAB in the last 72  hours. Anemia Panel: No results for input(s): VITAMINB12, FOLATE, FERRITIN, TIBC, IRON, RETICCTPCT in the last 72 hours. Sepsis Labs: No results for input(s): PROCALCITON, LATICACIDVEN in the last 168 hours.  Recent Results (from the past 240 hour(s))  MRSA PCR Screening     Status: None   Collection Time: 12/18/15  6:35 AM  Result Value Ref Range Status   MRSA by PCR NEGATIVE NEGATIVE Final    Comment:        The GeneXpert MRSA Assay (FDA approved for NASAL specimens only), is one component of a comprehensive MRSA colonization surveillance program. It is not intended to diagnose MRSA infection nor to guide or monitor treatment for MRSA infections.          Radiology Studies: Dg Chest 2 View  Result Date: 12/17/2015 CLINICAL DATA:  Pain EXAM: CHEST  2 VIEW COMPARISON:  12/07/2015 FINDINGS: The heart size and mediastinal contours are within normal limits. Both lungs are clear. The visualized skeletal structures are unremarkable. IMPRESSION: No active cardiopulmonary disease. Electronically Signed   By: Signa Kellaylor  Stroud M.D.   On: 12/17/2015 21:15   Ct Angio Chest/abd/pel For Dissection W And/or Wo Contrast  Addendum Date: 12/18/2015   ADDENDUM REPORT: 12/18/2015 00:42 ADDENDUM: Additional comparison is made to the CT of the abdomen pelvis dated 12/15/2015. Indistinct aortic wall and periaortic fat is similar to the study dated 12/15/2015 and may be related to chronic inflammation or scarring or represent a more acute process. Clinical correlation is recommended. No definite active extravasation of contrast identified. Electronically Signed   By: Elgie CollardArash  Radparvar M.D.   On: 12/18/2015 00:42   Result Date: 12/18/2015 CLINICAL DATA:  65 year old female with epigastric radiating to the back. EXAM: CT ANGIOGRAPHY CHEST, ABDOMEN AND PELVIS TECHNIQUE: Multidetector CT imaging through the chest, abdomen and pelvis was performed using the standard protocol during bolus administration of  intravenous contrast. Multiplanar reconstructed images and MIPs were obtained and reviewed to evaluate the vascular anatomy. CONTRAST:  Its 100 cc Isovue 370 COMPARISON:  Chest CT dated 03/11/2015 and abdominal CT dated 11/15/2012 FINDINGS: CTA CHEST FINDINGS There is mild centrilobular and paraseptal emphysema. The lungs are clear. There is no pleural effusion or pneumothorax. The central airways are patent. There is mild atherosclerotic calcification of thoracic aorta. There is no aneurysmal dilatation or evidence of dissection. The origins of the great vessels of the aortic arch appear patent. There is no CT evidence of pulmonary embolism. A top-normal cardiac size. There is coronary vascular calcification. There is no pericardial effusion. Multiple top-normal mediastinal lymph node in the perivascular space, cardiopulmonary window, and right paratracheal region of indeterminate clinical significance. There is no hilar adenopathy. The esophagus is grossly unremarkable. No thyroid nodules identified. There is no axillary adenopathy. The chest wall soft tissues appear unremarkable. There is osteopenia with degenerative changes of spine. No acute  fracture. Review of the MIP images confirms the above findings. CTA ABDOMEN AND PELVIS FINDINGS No intra-abdominal free air. Small hypoattenuating area in the right posterior hemipelvis may represent ovarian tissue versus trace free fluid. The liver is, gallbladder, pancreas, spleen, and adrenal glands appear unremarkable. The kidneys, visualized ureters, and urinary bladder appear unremarkable. There is apparent diffuse thickening of the bladder wall which may be partly related to underdistention. Cystitis is not excluded. Correlation with urinalysis recommended. There is redundancy of the sigmoid colon in. There is no evidence of bowel obstruction or active inflammation. Normal appendix. There is advanced aortoiliac atherosclerotic disease. There are diffuse noncalcified  plaques versus less likely mural hematoma along the course of the abdominal aorta and common iliac arteries. There is no aortic dissection. There is a fusiform infrarenal abdominal aortic aneurysm measuring up to 4.3 cm in greatest transverse axial diameter, increased in size since the prior CT of 11/15/2012 where it measured approximately 3.7 cm. There is partial thrombosis of the aneurysmal lumen chest above the bifurcation of the aorta. There is no extravasation of contrast or evidence of active hemorrhage. However, there is diffuse thickened appearance of the aortic wall and aneurysmal sac with infiltration of the immediate periaortic fat. Therefore mild intramural hemorrhage or mild aortic leak with potential for impending rupture is not excluded. Clinical correlation and vascular surgery consult is advised. The origins of the celiac axis, SMA, IMA as well as the origins of the renal arteries appear patent. There is a standard celiac axis branching anatomy. There are aneurysmal dilatation of the common iliac arteries measuring approximately 2.5 cm in diameter. The external and internal iliac arteries appear patent. No portal venous gas identified. There is no adenopathy. The abdominal wall soft tissues appear unremarkable. There is mild degenerative changes of the spine. No acute fracture. Review of the MIP images confirms the above findings. IMPRESSION: No CT evidence of thoracic aortic aneurysm or dissection. Fusiform infrarenal abdominal aortic aneurysm measuring up to 4.3 cm in axial diameter and increased since the study dated 2014 as well as aneurysmal dilatation of the common iliac arteries. No active extravasation of contrast identified. However, there is diffusely thickened appearance of the wall of the aorta with infiltrated appearance of the periaortic fat concerning for mural hemorrhage or mild periaortic leak. An impending rupture is not excluded. Correlation with clinical exam and vascular surgery  consult is advised. 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 These results were called by telephone at the time of interpretation on 12/18/2015 at 12:20 am to Dr. Mattie Marlin , who verbally acknowledged these results. Electronically Signed: By: Elgie Collard M.D. On: 12/18/2015 00:22   US Abdomen Limited Ruq  Result Date: 12/18/2015 CLINICAL DATA:  Acute onset of epigastric abdominal pain and constipation. Initial encounter. EXAM: US ABDOMEN LIMITED - RIGHT UPPER QUADRANT COMPARISON:  CT of the abdomen and pelvis from 11/15/2012 FINDINGS: Gallbladder: No gallstones or wall thickening visualized. No sonographic Murphy sign noted by sonographer. Common bile duct: Diameter: 0.4 cm, within normal limits in caliber. Liver: No focal lesion identified. Mildly heterogeneous parenchymal echogenicity may reflect fatty infiltration. IMPRESSION: 1. No acute abnormality seen at the right upper quadrant. 2. Suggestion of mild fatty infiltration within the liver. Electronically Signed   By: Roanna Raider M.D.   On: 12/18/2015 03:48        Scheduled Meds: . atorvastatin  80 mg Oral Daily  . benazepril  40 mg Oral BID  . carvedilol  12.5 mg Oral BID WC  . docusate sodium  100 mg Oral BID  . escitalopram  10 mg Oral Daily  . ferrous sulfate  325 mg Oral BID  . pantoprazole  40 mg Oral BID  . pneumococcal 23 valent vaccine  0.5 mL Intramuscular Tomorrow-1000  . polyethylene glycol  17 g Oral Daily  . potassium chloride SA  20 mEq Oral BID  . sodium chloride flush  3 mL Intravenous Q12H   Continuous Infusions: . sodium chloride 1,000 mL (12/18/15 2212)     LOS: 1 day     Jacquelin Hawking, MD [facility] 12/19/2015, 7:33 AM Pager: [phone]  If 7PM-7AM, please contact night-coverage [email or web address] Password [facility] 12/19/2015, 7:33 AM

## 2015-12-19 NOTE — Progress Notes (Signed)
Initial Nutrition Assessment  DOCUMENTATION CODES:   Severe malnutrition in context of acute illness/injury, Obesity unspecified  INTERVENTION:  Magic cup TID with meals, each supplement provides 290 kcal and 9 grams of protein.  Recommend snacks TID between meals. Will order for patient.   NUTRITION DIAGNOSIS:   Malnutrition (Severe) related to acute illness (Abdominal pain) as evidenced by energy intake < or equal to 50% for > or equal to 5 days, 13 percent weight loss in 1-2 months, mild depletion of body fat, moderate depletion of body fat, mild depletion of muscle mass, moderate depletions of muscle mass.  GOAL:   Patient will meet greater than or equal to 90% of their needs  MONITOR:   PO intake, Supplement acceptance, I & O's, Weight trends  REASON FOR ASSESSMENT:   Malnutrition Screening Tool    ASSESSMENT:   65 y.o. female with medical history significant of AAA which is known to be 4.3 cm in size, GERD, h/o colon polyps, HTN.  Patient presents to the ED with c/o upper abdominal discomfort. Workup ongoing for etiology of abdominal pain.   Patient reports poor appetite with absence of hunger, abdominal pain, and constipation for the past month. During that time she has been eating <50% of usual intake, some days only 25%. Usually she would eat 2 meals per day and snacks. UBW 204 lbs - no record of this weight in chart. Last weight was 220 lbs in 06/03/2012. According to patient she has lost 11.7 kg (13% body weight) in the past 1-2 months. Patient is amenable to Magic Cup TID - discussed option for ordering from pharmacy outpatient. She does not want to do Ensure because she could not afford to keep up with it at home. Also amenable to snacks TID between meals.  Medications reviewed and include: colace, ferrous sulfate 325 mg BID, pantoprazole, miralax.  Labs reviewed; all WNL.  Nutrition-Focused physical exam completed. Findings are mild to moderate fat depletion, mild  to moderate muscle depletion, and no edema. Patient endorses noticeable changes in muscle and fat stores.  Diet Order:  DIET SOFT Room service appropriate? Yes; Fluid consistency: Thin  Skin:  Reviewed, no issues  Last BM:  12/16/2015  Height:   Ht Readings from Last 1 Encounters:  12/18/15 5\' 4"  (1.626 m)    Weight:   Wt Readings from Last 1 Encounters:  12/18/15 178 lb 9.2 oz (81 kg)    Ideal Body Weight:  54.54 kg  BMI:  Body mass index is 30.65 kg/m.  Estimated Nutritional Needs:   Kcal:  1600-1800  Protein:  100-115 grams  Fluid:  >/= 1.6 L/day  EDUCATION NEEDS:   Education needs addressed (Small, frequent meals. Emphasis on adequate protein and calories; discussed examples for patient.)  Helane RimaLeanne Chapel Silverthorn, MS, RD, LDN Pager: [phone] After Hours Pager: [phone]

## 2015-12-19 NOTE — Op Note (Signed)
[facility] Patient Name: Briana Hernandez Procedure Date : 12/19/2015 MRN: [id number] Attending MD: Vida Rigger , MD Date of Birth: 08/10/1950 CSN: [id number] Age: 65 Admit Type: Inpatient Procedure:                Upper GI endoscopy Indications:              Epigastric abdominal pain Providers:                Vida Rigger, MD, Will Bonnet RN, RN, Clearnce Sorrel, Technician, Merideth Abbey, CRNA Referring MD:              Medicines:                Propofol total dose 150 mg IV Complications:            No immediate complications. Estimated Blood Loss:     Estimated blood loss: none. Procedure:                Pre-Anesthesia Assessment:                           - Prior to the procedure, a History and Physical                            was performed, and patient medications and                            allergies were reviewed. The patient's tolerance of                            previous anesthesia was also reviewed. The risks                            and benefits of the procedure and the sedation                            options and risks were discussed with the patient.                            All questions were answered, and informed consent                            was obtained. Prior Anticoagulants: The patient has                            taken no previous anticoagulant or antiplatelet                            agents. ASA Grade Assessment: II - A patient with                            mild systemic disease. After reviewing the risks  and benefits, the patient was deemed in                            satisfactory condition to undergo the procedure.                           After obtaining informed consent, the endoscope was                            passed under direct vision. Throughout the                            procedure, the patient's blood pressure, pulse, and   oxygen saturations were monitored continuously. The                            [id number] ([id number]) scope was introduced through the                            mouth, and advanced to the second part of duodenum.                            The upper GI endoscopy was accomplished without                            difficulty. The patient tolerated the procedure                            well. Scope In: Scope Out: Findings:      A small hiatal hernia was present.      The entire examined stomach was normal.      The duodenal bulb, first portion of the duodenum and second portion of       the duodenum were normal.      The exam was otherwise without abnormality. Impression:               - Small hiatal hernia.                           - Normal stomach.                           - Normal duodenal bulb, first portion of the                            duodenum and second portion of the duodenum.                           - The examination was otherwise normal.                           - No specimens collected. Moderate Sedation:      moderate sedation-none Recommendation:           - Patient has a contact number available for  emergencies. The signs and symptoms of potential                            delayed complications were discussed with the                            patient. Return to normal activities tomorrow.                            Written discharge instructions were provided to the                            patient.                           - Soft diet today. home soon hopefully and consider                            gallbladder ultrasound and/or CCK PIPIDA scan if                            symptoms continue                           - Continue present medications.                           - Return to GI clinic PRN.                           - Telephone GI clinic if symptomatic PRN. Procedure Code(s):        --- Professional ---                            [phone], Esophagogastroduodenoscopy, flexible,                            transoral; diagnostic, including collection of                            specimen(s) by brushing or washing, when performed                            (separate procedure) Diagnosis Code(s):        --- Professional ---                           K44.9, Diaphragmatic hernia without obstruction or                            gangrene                           R10.13, Epigastric pain CPT copyright 2016 [employer]. All rights reserved. The codes documented in this report are preliminary and upon coder review may  be revised to meet current compliance requirements. Vida Rigger, MD 12/19/2015  3:11:16 PM This report has been signed electronically. Number of Addenda: 0

## 2015-12-19 NOTE — Anesthesia Preprocedure Evaluation (Addendum)
Anesthesia Evaluation  Patient identified by MRN, date of birth, ID band Patient awake    Reviewed: Allergy & Precautions, NPO status , Patient's Chart, lab work & pertinent test results  Airway Mallampati: II  TM Distance: >3 FB     Dental   Pulmonary sleep apnea , former smoker,    breath sounds clear to auscultation       Cardiovascular hypertension, + Peripheral Vascular Disease   Rhythm:Regular Rate:Normal     Neuro/Psych    GI/Hepatic Neg liver ROS, GERD  ,  Endo/Other  negative endocrine ROS  Renal/GU negative Renal ROS     Musculoskeletal  (+) Arthritis ,   Abdominal   Peds  Hematology negative hematology ROS (+)   Anesthesia Other Findings   Reproductive/Obstetrics                            Anesthesia Physical Anesthesia Plan  ASA: III  Anesthesia Plan: MAC   Post-op Pain Management:    Induction: Intravenous  Airway Management Planned: Natural Airway and Nasal Cannula  Additional Equipment:   Intra-op Plan:   Post-operative Plan:   Informed Consent: I have reviewed the patients History and Physical, chart, labs and discussed the procedure including the risks, benefits and alternatives for the proposed anesthesia with the patient or authorized representative who has indicated his/her understanding and acceptance.     Plan Discussed with: CRNA  Anesthesia Plan Comments:         Anesthesia Quick Evaluation

## 2015-12-19 NOTE — Transfer of Care (Signed)
Immediate Anesthesia Transfer of Care Note  Patient: Briana Hernandez  Procedure(s) Performed: Procedure(s): ESOPHAGOGASTRODUODENOSCOPY (EGD) WITH PROPOFOL (N/A)  Patient Location: PACU and Endoscopy Unit  Anesthesia Type:MAC  Level of Consciousness: awake, alert , oriented and patient cooperative  Airway & Oxygen Therapy: Patient Spontanous Breathing and Patient connected to nasal cannula oxygen  Post-op Assessment: Report given to RN and Post -op Vital signs reviewed and stable  Post vital signs: Reviewed and stable  Last Vitals:  Vitals:   12/19/15 1131 12/19/15 1415  BP: 102/68 (!) 178/87  Pulse: 62   Resp: 16 19  Temp: 36.5 C 36.5 C    Last Pain:  Vitals:   12/19/15 1415  TempSrc: Oral  PainSc: 0-No pain         Complications: No apparent anesthesia complications

## 2015-12-19 NOTE — Care Management Note (Signed)
Case Management Note  Patient Details  Name: Briana Hernandez MRN: [id number] Date of Birth: 09/01/1950  Subjective/Objective:  Presents with abd pain, has AAA ,  Patient is from home with spouse, pta, indep, she has pcp, she has medication coverage thru insurance, she has transport at [facility].  NCM will con to follow for dc needs.                  Action/Plan:   Expected Discharge Date:                  Expected Discharge Plan:  Home/Self Care  In-House Referral:     Discharge planning Services  CM Consult  Post Acute Care Choice:    Choice offered to:     DME Arranged:    DME Agency:     HH Arranged:    HH Agency:     Status of Service:  In process, will continue to follow  If discussed at Long Length of Stay Meetings, dates discussed:    Additional Comments:  [name], Kimbrely Buckel Clinton, RN 12/19/2015, 5:40 PM

## 2015-12-19 NOTE — Progress Notes (Addendum)
Pilar PlateGloria B Kneisley 2:41 PM  Subjective: Patient seen and examined and her hospital computer chart was reviewed as well as her records from her [place] colonoscopy and endoscopy in June which were okay and her case discussed with my partner Dr. Evette CristalGanem  Objective: Vital signs stable afebrile no acute distress exam please see preassessment evaluation labs and CT from [place] reviewed  Assessment: Abdominal pain questionable etiology  Plan: Okay to double check endoscopy just to be sure and patient agrees  [facility] IncMAGOD,Bertrum Helmstetter E  Pager [phone] After 5PM or if no answer call [phone]

## 2015-12-20 ENCOUNTER — Inpatient Hospital Stay (HOSPITAL_COMMUNITY): Payer: Medicare Other

## 2015-12-20 ENCOUNTER — Encounter (HOSPITAL_COMMUNITY): Payer: Self-pay | Admitting: Gastroenterology

## 2015-12-20 DIAGNOSIS — R1011 Right upper quadrant pain: Secondary | ICD-10-CM | POA: Diagnosis not present

## 2015-12-20 DIAGNOSIS — I714 Abdominal aortic aneurysm, without rupture: Secondary | ICD-10-CM | POA: Diagnosis not present

## 2015-12-20 DIAGNOSIS — K660 Peritoneal adhesions (postprocedural) (postinfection): Secondary | ICD-10-CM | POA: Diagnosis not present

## 2015-12-20 DIAGNOSIS — K219 Gastro-esophageal reflux disease without esophagitis: Secondary | ICD-10-CM | POA: Diagnosis not present

## 2015-12-20 DIAGNOSIS — R109 Unspecified abdominal pain: Secondary | ICD-10-CM | POA: Diagnosis not present

## 2015-12-20 DIAGNOSIS — R1013 Epigastric pain: Secondary | ICD-10-CM | POA: Diagnosis not present

## 2015-12-20 DIAGNOSIS — I10 Essential (primary) hypertension: Secondary | ICD-10-CM | POA: Diagnosis not present

## 2015-12-20 LAB — BASIC METABOLIC PANEL
ANION GAP: 10 (ref 5–15)
CALCIUM: 9.5 mg/dL (ref 8.9–10.3)
CO2: 26 mmol/L (ref 22–32)
Chloride: 103 mmol/L (ref 101–111)
Creatinine, Ser: 0.7 mg/dL (ref 0.44–1.00)
GFR calc Af Amer: >60 mL/min (ref >60)
GLUCOSE: 87 mg/dL (ref 65–99)
Potassium: 3.8 mmol/L (ref 3.5–5.1)
Sodium: 139 mmol/L (ref 135–145)

## 2015-12-20 LAB — CBC
HCT: 36.2 % (ref 36.0–46.0)
Hemoglobin: 12 g/dL (ref 12.0–15.0)
MCH: 26.8 pg (ref 26.0–34.0)
MCHC: 33.1 g/dL (ref 30.0–36.0)
MCV: 80.8 fL (ref 78.0–100.0)
PLATELETS: 376 10*3/uL (ref 150–400)
RBC: 4.48 MIL/uL (ref 3.87–5.11)
RDW: 16 % — AB (ref 11.5–15.5)
WBC: 7.9 10*3/uL (ref 4.0–10.5)

## 2015-12-20 MED ORDER — TECHNETIUM TC 99M MEBROFENIN IV KIT
5.0000 | PACK | Freq: Once | INTRAVENOUS | Status: AC | PRN
  Administered 2015-12-20: 5 via INTRAVENOUS

## 2015-12-20 NOTE — Progress Notes (Addendum)
  Progress Note    12/20/2015 8:05 AM Hospital Day 3  Subjective:  Says she feels better today-pain is better; denies any back pain  Afebrile HR 50's-60's SB/NSR 100's-150's systolic (one episode yesterday of 190 systolic) 98% RA  Vitals:   12/20/15 0356 12/20/15 0749  BP: (!) 120/59 (!) 150/96  Pulse: (!) 55 65  Resp: 14 17  Temp: 98 F (36.7 C) 98.7 F (37.1 C)    Physical Exam: Cardiac:  regular Lungs:  Non labored Abdomen:  Still with mild tenderness to palpation RUQ and epigastric region.  Non tender to all other quadrants Extremities:  2+ palpable bilateral radial pulses and DP pulses  CBC    Component Value Date/Time   WBC 7.9 12/20/2015 0352   RBC 4.48 12/20/2015 0352   HGB 12.0 12/20/2015 0352   HCT 36.2 12/20/2015 0352   PLT 376 12/20/2015 0352   MCV 80.8 12/20/2015 0352   MCH 26.8 12/20/2015 0352   MCHC 33.1 12/20/2015 0352   RDW 16.0 (H) 12/20/2015 0352   LYMPHSABS 4.4 (H) 11/15/2012 2220   MONOABS 0.6 11/15/2012 2220   EOSABS 0.1 11/15/2012 2220   BASOSABS 0.0 11/15/2012 2220    BMET    Component Value Date/Time   NA 139 12/20/2015 0352   K 3.8 12/20/2015 0352   CL 103 12/20/2015 0352   CO2 26 12/20/2015 0352   GLUCOSE 87 12/20/2015 0352   BUN <5 (L) 12/20/2015 0352   CREATININE 0.70 12/20/2015 0352   CREATININE 0.70 12/21/2011 1201   CALCIUM 9.5 12/20/2015 0352   GFRNONAA >60 12/20/2015 0352   GFRAA >60 12/20/2015 0352    INR No results found for: INR   Intake/Output Summary (Last 24 hours) at 12/20/15 0805 Last data filed at 12/19/15 2200  Gross per 24 hour  Intake              592 ml  Output                0 ml  Net              592 ml     Assessment/Plan:  65 y.o. female with abdominal pain with know AAA, which measures 4.2cm with no evidence of leak on CT scan  Hospital Day 3   -pt feeling a little better today-still with some tenderness in the RUQ and epigastric region, but no other quadrants are tender to palpation.   She denies back pain. -EGD was essentially negative with a small hiatal hernia -consider workup of gallbladder as is still doubtful this is related to her aneurysm.  At discharge, will need to [facility]. -will continue to follow while in the hospital.   Doreatha MassedSamantha Rhyne, PA-C [facility] [phone] 12/20/2015 8:05 AM

## 2015-12-20 NOTE — Plan of Care (Signed)
Problem: Safety: Goal: Ability to remain free from injury will improve Outcome: Progressing Pt continues to call out for assistance

## 2015-12-20 NOTE — Anesthesia Postprocedure Evaluation (Signed)
Anesthesia Post Note  Patient: Pilar PlateGloria B Ropp  Procedure(s) Performed: Procedure(s) (LRB): ESOPHAGOGASTRODUODENOSCOPY (EGD) WITH PROPOFOL (N/A)  Patient location during evaluation: PACU Anesthesia Type: MAC Level of consciousness: awake and alert Pain management: pain level controlled Vital Signs Assessment: post-procedure vital signs reviewed and stable Respiratory status: spontaneous breathing, nonlabored ventilation, respiratory function stable and patient connected to nasal cannula oxygen Cardiovascular status: stable and blood pressure returned to baseline Anesthetic complications: no    Last Vitals:  Vitals:   12/20/15 0356 12/20/15 0749  BP: (!) 120/59 (!) 150/96  Pulse: (!) 55 65  Resp: 14 17  Temp: 36.7 C 37.1 C    Last Pain:  Vitals:   12/20/15 0749  TempSrc: Oral  PainSc: 0-No pain                 Kennieth RadFitzgerald, Jacory Kamel E

## 2015-12-20 NOTE — Progress Notes (Signed)
PROGRESS NOTE    Briana Hernandez  ZOX:[id number] DOB: June 24, 1950 DOA: 12/17/2015 PCP: Dennis Bast, MD   Brief Narrative: Briana Hernandez is a 65 y.o.    Assessment & Plan:   Principal Problem:   Intractable abdominal pain Active Problems:   HTN (hypertension)   GERD (gastroesophageal reflux disease)   AAA (abdominal aortic aneurysm) (HCC)   Intractable abdominal patient Abdominal aortic aneurism GERD Pain improved. Small hiatal hernia on EGD, otherwise workup continues to be negative. -HIDA scan today. If negative, patient will likely be discharged home with vascular follow-up -aggressive BP control  Hypertension Continues to be uncontrolled but slightly improved. Goal with aneurysm is less than systolic -Continue Coreg twice a day -Continue been benazepril -Continue hydralazine when necessary -start hydrochlorothiazide 12.5mg  daily today  Unintentional weight loss Patient had recent EGD and for stricture. CTA chest abdomen and pelvis negative for malignancy. Albumin slightly low   DVT prophylaxis: SCDs Code Status: Full code  Family Communication: None at bedside Disposition Plan: Discharge today or tomorrow pending HIDA scan   Consultants:   Vascular surgery  Gastroenterology  Procedures:   None  Antimicrobials:  None    Subjective: Pain has significantly improved. No concern overnight.  Objective: Vitals:   12/20/15 0000 12/20/15 0356 12/20/15 0749 12/20/15 1211  BP: 131/80 (!) 120/59 (!) 150/96 136/66  Pulse: (!) 57 (!) 55 65 (!) 52  Resp: 15 14 17 16   Temp:  98 F (36.7 C) 98.7 F (37.1 C) 97.7 F (36.5 C)  TempSrc:  Oral Oral Oral  SpO2: 95% 93% 100% 98%  Weight:      Height:        Intake/Output Summary (Last 24 hours) at 12/20/15 1231 Last data filed at 12/19/15 2200  Gross per 24 hour  Intake              550 ml  Output                0 ml  Net              550 ml   Filed Weights   12/18/15 0440  Weight: 81 kg  (178 lb 9.2 oz)    Examination:  General exam: Appears calm and comfortable. No distress. Sitting in chair. Respiratory system: Clear to auscultation. Respiratory effort normal. Cardiovascular system: S1 & S2 heard, RRR. No murmurs, rubs, gallops or clicks. Gastrointestinal system: Abdomen is nondistended, soft and non-tender. Normal bowel sounds heard. Central nervous system: Alert and oriented. No focal neurological deficits. Extremities: No cyanosis. No edema Skin: No rashes, lesions or ulcers Psychiatry: Judgement and insight appear normal. Mood & affect appropriate.     Data Reviewed: I have personally reviewed following labs and imaging studies  CBC:  Recent Labs Lab 12/17/15 2013 12/20/15 0352  WBC 7.8 7.9  HGB 12.7 12.0  HCT 38.5 36.2  MCV 80.9 80.8  PLT 419* 376   Basic Metabolic Panel:  Recent Labs Lab 12/17/15 2013 12/19/15 0215 12/20/15 0352  NA 138 138 139  K 3.3* 3.6 3.8  CL 104 107 103  CO2 26 25 26   GLUCOSE 146* 84 87  BUN <5* <5* <5*  CREATININE 0.65 0.49 0.70  CALCIUM 9.4 9.1 9.5   GFR: Estimated Creatinine Clearance (by C-G formula based on SCr of 0.8 mg/dL) Female: 81.1 mL/min Female: 88.4 mL/min Liver Function Tests:  Recent Labs Lab 12/17/15 2223  AST 28  ALT 14  ALKPHOS 59  BILITOT 0.4  PROT 6.9  ALBUMIN 3.1*    Recent Labs Lab 12/17/15 2223  LIPASE 37   No results for input(s): AMMONIA in the last 168 hours. Coagulation Profile: No results for input(s): INR, PROTIME in the last 168 hours. Cardiac Enzymes:  Recent Labs Lab 12/18/15 0517 12/18/15 1458  TROPONINI <0.03  0.03* <0.03   BNP (last 3 results) No results for input(s): PROBNP in the last 8760 hours. HbA1C: No results for input(s): HGBA1C in the last 72 hours. CBG: No results for input(s): GLUCAP in the last 168 hours. Lipid Profile: No results for input(s): CHOL, HDL, LDLCALC, TRIG, CHOLHDL, LDLDIRECT in the last 72 hours. Thyroid Function Tests: No  results for input(s): TSH, T4TOTAL, FREET4, T3FREE, THYROIDAB in the last 72 hours. Anemia Panel: No results for input(s): VITAMINB12, FOLATE, FERRITIN, TIBC, IRON, RETICCTPCT in the last 72 hours. Sepsis Labs: No results for input(s): PROCALCITON, LATICACIDVEN in the last 168 hours.  Recent Results (from the past 240 hour(s))  MRSA PCR Screening     Status: None   Collection Time: 12/18/15  6:35 AM  Result Value Ref Range Status   MRSA by PCR NEGATIVE NEGATIVE Final    Comment:        The GeneXpert MRSA Assay (FDA approved for NASAL specimens only), is one component of a comprehensive MRSA colonization surveillance program. It is not intended to diagnose MRSA infection nor to guide or monitor treatment for MRSA infections.          Radiology Studies: No results found.      Scheduled Meds: . atorvastatin  80 mg Oral Daily  . benazepril  40 mg Oral BID  . carvedilol  12.5 mg Oral BID WC  . docusate sodium  100 mg Oral BID  . escitalopram  10 mg Oral Daily  . ferrous sulfate  325 mg Oral BID  . hydrochlorothiazide  12.5 mg Oral Daily  . pantoprazole  40 mg Oral BID  . polyethylene glycol  17 g Oral Daily  . sodium chloride flush  3 mL Intravenous Q12H   Continuous Infusions:     LOS: 2 days     Jacquelin Hawkingalph Rashell Shambaugh, MD [facility] 12/20/2015, 12:31 PM Pager: [phone]  If 7PM-7AM, please contact night-coverage [email or web address] Password [email or web address] 12/20/2015, 12:31 PM

## 2015-12-20 NOTE — Progress Notes (Signed)
Briana CallerGloria B Hernandez 10:39 AM  Subjective: The patient is currently not having pain and no problem from her endoscopy and tolerated regular food and has no new complaints  Objective: Vital signs stable afebrile no acute distress abdomen is soft nontender labs stable CT and ultrasound okay and she's now had 2 endoscopies and a colonoscopy at a another facility   Assessment: Abdominal pain questionable etiology possibly just adhesional  Plan: Please let me know if I help any further with this hospital stay consider a [facility] HIDA scan if you're still concerned about gallbladder  [facility],Briana Hernandez E  Pager [phone] After 5PM or if no answer call [phone]

## 2015-12-21 DIAGNOSIS — R1013 Epigastric pain: Secondary | ICD-10-CM | POA: Diagnosis not present

## 2015-12-21 DIAGNOSIS — R109 Unspecified abdominal pain: Secondary | ICD-10-CM | POA: Diagnosis not present

## 2015-12-21 DIAGNOSIS — I10 Essential (primary) hypertension: Secondary | ICD-10-CM | POA: Diagnosis not present

## 2015-12-21 DIAGNOSIS — R1011 Right upper quadrant pain: Secondary | ICD-10-CM | POA: Diagnosis not present

## 2015-12-21 DIAGNOSIS — K210 Gastro-esophageal reflux disease with esophagitis: Secondary | ICD-10-CM

## 2015-12-21 DIAGNOSIS — I713 Abdominal aortic aneurysm, ruptured: Secondary | ICD-10-CM | POA: Diagnosis not present

## 2015-12-21 DIAGNOSIS — K660 Peritoneal adhesions (postprocedural) (postinfection): Secondary | ICD-10-CM | POA: Diagnosis not present

## 2015-12-21 DIAGNOSIS — I714 Abdominal aortic aneurysm, without rupture: Secondary | ICD-10-CM | POA: Diagnosis not present

## 2015-12-21 LAB — BASIC METABOLIC PANEL
Anion gap: 9 (ref 5–15)
BUN: <5 mg/dL — ABNORMAL LOW (ref 6–20)
CALCIUM: 9.2 mg/dL (ref 8.9–10.3)
CO2: 26 mmol/L (ref 22–32)
CREATININE: 0.7 mg/dL (ref 0.44–1.00)
Chloride: 101 mmol/L (ref 101–111)
GFR calc non Af Amer: >60 mL/min (ref >60)
Glucose, Bld: 98 mg/dL (ref 65–99)
Potassium: 3.6 mmol/L (ref 3.5–5.1)
SODIUM: 136 mmol/L (ref 135–145)

## 2015-12-21 MED ORDER — POLYETHYLENE GLYCOL 3350 17 G PO PACK: 17.0000 g | PACK | Freq: Every day | ORAL | 0 refills | Status: AC

## 2015-12-21 NOTE — Care Management Important Message (Signed)
Important Message  Patient Details  Name: Briana Hernandez MRN: [id number] Date of Birth: 08/16/1950   Medicare Important Message Given:  Yes    Marwan Lipe Abena 12/21/2015, 11:40 AM

## 2015-12-21 NOTE — Progress Notes (Signed)
Subjective: Interval History: none.. Reports feeling gassy. Feels that she needs to have a bowel movement. Was giving something for balance yesterday but no bowel movement yet. Reports her abdominal pain is much better.  Objective: Vital signs in last 24 hours: Temp:  [97.7 F (36.5 C)-98.7 F (37.1 C)] 98.2 F (36.8 C) (09/13 0245) Pulse Rate:  [52-81] 61 (09/13 0245) Resp:  [11-22] 16 (09/13 0245) BP: (106-161)/(66-96) 108/66 (09/13 0245) SpO2:  [97 %-100 %] 97 % (09/13 0245)  Intake/Output from previous day: No intake/output data recorded. Intake/Output this shift: No intake/output data recorded.  No change. Abdomen soft and nontender.  Lab Results:  Recent Labs  12/20/15 0352  WBC 7.9  HGB 12.0  HCT 36.2  PLT 376   BMET  Recent Labs  12/20/15 0352 12/21/15 0338  NA 139 136  K 3.8 3.6  CL 103 101  CO2 26 26  GLUCOSE 87 98  BUN <5* <5*  CREATININE 0.70 0.70  CALCIUM 9.5 9.2    Studies/Results: Dg Chest 2 View  Result Date: 12/17/2015 CLINICAL DATA:  Pain EXAM: CHEST  2 VIEW COMPARISON:  12/07/2015 FINDINGS: The heart size and mediastinal contours are within normal limits. Both lungs are clear. The visualized skeletal structures are unremarkable. IMPRESSION: No active cardiopulmonary disease. Electronically Signed   By: Signa Kell M.D.   On: 12/17/2015 21:15   Nm Hepato W/eject Fract  Result Date: 12/20/2015 CLINICAL DATA:  Epigastric pain for 5 days. EXAM: NUCLEAR MEDICINE HEPATOBILIARY IMAGING WITH GALLBLADDER EF TECHNIQUE: Sequential images of the abdomen were obtained out to 60 minutes following intravenous administration of radiopharmaceutical. After oral ingestion of Ensure, gallbladder ejection fraction was determined. At 60 min, normal ejection fraction is greater than 33%. RADIOPHARMACEUTICALS:  5.1 mCi Tc-31m  Choletec IV COMPARISON:  None. FINDINGS: Prompt uptake and biliary excretion of activity by the liver is seen. Gallbladder activity is  visualized, consistent with patency of cystic duct. Biliary activity passes into small bowel, consistent with patent common bile duct. Calculated gallbladder ejection fraction is 95%. (Normal gallbladder ejection fraction with Ensure is greater than 33%.) IMPRESSION: Normal filling of the gallbladder. Normal ejection fraction of 95% after a fatty meal. Electronically Signed   By: Gerome Sam III M.D   On: 12/20/2015 18:40   Ct Angio Chest/abd/pel For Dissection W And/or Wo Contrast  Addendum Date: 12/18/2015   ADDENDUM REPORT: 12/18/2015 00:42 ADDENDUM: Additional comparison is made to the CT of the abdomen pelvis dated 12/15/2015. Indistinct aortic wall and periaortic fat is similar to the study dated 12/15/2015 and may be related to chronic inflammation or scarring or represent a more acute process. Clinical correlation is recommended. No definite active extravasation of contrast identified. Electronically Signed   By: Elgie Collard M.D.   On: 12/18/2015 00:42   Result Date: 12/18/2015 CLINICAL DATA:  65 year old female with epigastric radiating to the back. EXAM: CT ANGIOGRAPHY CHEST, ABDOMEN AND PELVIS TECHNIQUE: Multidetector CT imaging through the chest, abdomen and pelvis was performed using the standard protocol during bolus administration of intravenous contrast. Multiplanar reconstructed images and MIPs were obtained and reviewed to evaluate the vascular anatomy. CONTRAST:  Its 100 cc Isovue 370 COMPARISON:  Chest CT dated 03/11/2015 and abdominal CT dated 11/15/2012 FINDINGS: CTA CHEST FINDINGS There is mild centrilobular and paraseptal emphysema. The lungs are clear. There is no pleural effusion or pneumothorax. The central airways are patent. There is mild atherosclerotic calcification of thoracic aorta. There is no aneurysmal dilatation or evidence of dissection.  The origins of the great vessels of the aortic arch appear patent. There is no CT evidence of pulmonary embolism. A top-normal  cardiac size. There is coronary vascular calcification. There is no pericardial effusion. Multiple top-normal mediastinal lymph node in the perivascular space, cardiopulmonary window, and right paratracheal region of indeterminate clinical significance. There is no hilar adenopathy. The esophagus is grossly unremarkable. No thyroid nodules identified. There is no axillary adenopathy. The chest wall soft tissues appear unremarkable. There is osteopenia with degenerative changes of spine. No acute fracture. Review of the MIP images confirms the above findings. CTA ABDOMEN AND PELVIS FINDINGS No intra-abdominal free air. Small hypoattenuating area in the right posterior hemipelvis may represent ovarian tissue versus trace free fluid. The liver is, gallbladder, pancreas, spleen, and adrenal glands appear unremarkable. The kidneys, visualized ureters, and urinary bladder appear unremarkable. There is apparent diffuse thickening of the bladder wall which may be partly related to underdistention. Cystitis is not excluded. Correlation with urinalysis recommended. There is redundancy of the sigmoid colon in. There is no evidence of bowel obstruction or active inflammation. Normal appendix. There is advanced aortoiliac atherosclerotic disease. There are diffuse noncalcified plaques versus less likely mural hematoma along the course of the abdominal aorta and common iliac arteries. There is no aortic dissection. There is a fusiform infrarenal abdominal aortic aneurysm measuring up to 4.3 cm in greatest transverse axial diameter, increased in size since the prior CT of 11/15/2012 where it measured approximately 3.7 cm. There is partial thrombosis of the aneurysmal lumen chest above the bifurcation of the aorta. There is no extravasation of contrast or evidence of active hemorrhage. However, there is diffuse thickened appearance of the aortic wall and aneurysmal sac with infiltration of the immediate periaortic fat. Therefore  mild intramural hemorrhage or mild aortic leak with potential for impending rupture is not excluded. Clinical correlation and vascular surgery consult is advised. The origins of the celiac axis, SMA, IMA as well as the origins of the renal arteries appear patent. There is a standard celiac axis branching anatomy. There are aneurysmal dilatation of the common iliac arteries measuring approximately 2.5 cm in diameter. The external and internal iliac arteries appear patent. No portal venous gas identified. There is no adenopathy. The abdominal wall soft tissues appear unremarkable. There is mild degenerative changes of the spine. No acute fracture. Review of the MIP images confirms the above findings. IMPRESSION: No CT evidence of thoracic aortic aneurysm or dissection. Fusiform infrarenal abdominal aortic aneurysm measuring up to 4.3 cm in axial diameter and increased since the study dated 2014 as well as aneurysmal dilatation of the common iliac arteries. No active extravasation of contrast identified. However, there is diffusely thickened appearance of the wall of the aorta with infiltrated appearance of the periaortic fat concerning for mural hemorrhage or mild periaortic leak. An impending rupture is not excluded. Correlation with clinical exam and vascular surgery consult is advised. 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 These results were called by telephone at the time of interpretation on 12/18/2015 at 12:20 am to Dr. Mattie Marlin , who verbally acknowledged these results. Electronically Signed: By: Elgie Collard M.D. On: 12/18/2015 00:22   US Abdomen Limited Ruq  Result Date: 12/18/2015 CLINICAL DATA:  Acute onset of epigastric abdominal pain and constipation. Initial encounter. EXAM: US ABDOMEN LIMITED - RIGHT UPPER QUADRANT COMPARISON:  CT of the abdomen and pelvis from 11/15/2012 FINDINGS: Gallbladder: No gallstones or wall thickening visualized. No  sonographic Eulah Pont  sign noted by sonographer. Common bile duct: Diameter: 0.4 cm, within normal limits in caliber. Liver: No focal lesion identified. Mildly heterogeneous parenchymal echogenicity may reflect fatty infiltration. IMPRESSION: 1. No acute abnormality seen at the right upper quadrant. 2. Suggestion of mild fatty infiltration within the liver. Electronically Signed   By: Roanna RaiderJeffery  Chang M.D.   On: 12/18/2015 03:48   Anti-infectives: Anti-infectives    None      Assessment/Plan: s/p Procedure(s): ESOPHAGOGASTRODUODENOSCOPY (EGD) WITH PROPOFOL (N/A) Improvement in abdominal symptoms. Unclear etiology. No evidence of symptoms related to her aneurysm. Following from the side lines.   LOS: 3 days   Briana Hernandez, Briana Hernandez 12/21/2015, 6:54 AM

## 2015-12-21 NOTE — Care Management Note (Signed)
Case Management Note  Patient Details  Name: Briana Hernandez MRN: [id number] Date of Birth: 11/07/1950  Subjective/Objective:    Presents with abd pain, has AAA ,  Patient is from home with spouse, pta, indep, she has pcp, she has medication coverage thru insurance, [facility]  EGD unremarkable and Hida scan.  [facility]  No needs.                  Action/Plan:   Expected Discharge Date:                  Expected Discharge Plan:  Home/Self Care  In-House Referral:     Discharge planning Services  CM Consult  Post Acute Care Choice:    Choice offered to:     DME Arranged:    DME Agency:     HH Arranged:    HH Agency:     Status of Service:  Completed, signed off  If discussed at [employer] Length of Stay Meetings, dates discussed:    Additional Comments:  [name], [name], RN 12/21/2015, 12:25 PM

## 2015-12-21 NOTE — Discharge Summary (Signed)
Physician Discharge Summary  Briana Hernandez MRN: [id number] DOB/AGE: [birth year] 65 y.o.  PCPYong Channel, MD   Admit date: 12/17/2015 Discharge date: 12/21/2015  Discharge Diagnoses:    Principal Problem:   Intractable abdominal pain Active Problems:   HTN (hypertension)   GERD (gastroesophageal reflux disease)   AAA (abdominal aortic aneurysm) (HCC)    Follow-up recommendations Follow-up with PCP in 3-5 days , including all  additional recommended appointments as below Follow-up CBC, CMP in 3-5 days At discharge, will need to f/u with Dr. Oletta Lamas at [facility] for AAA.      Current Discharge Medication List    START taking these medications   Details  polyethylene glycol (MIRALAX / GLYCOLAX) packet Take 17 g by mouth daily. Qty: 14 each, Refills: 0      CONTINUE these medications which have NOT CHANGED   Details  atorvastatin (LIPITOR) 80 MG tablet Take 1 tablet (80 mg total) by mouth daily. Qty: 30 tablet, Refills: 5    benazepril (LOTENSIN) 40 MG tablet Take 40 mg by mouth 2 (two) times daily.    carvedilol (COREG) 25 MG tablet Take 12.5 mg by mouth 2 (two) times daily with a meal.    escitalopram (LEXAPRO) 10 MG tablet Take 10 mg by mouth every morning.    ferrous sulfate 325 (65 FE) MG EC tablet Take 325 mg by mouth 2 (two) times daily.    pantoprazole (PROTONIX) 40 MG tablet Take 40 mg by mouth 2 (two) times daily.    Potassium Chloride ER 20 MEQ TBCR Take 20 mEq by mouth 2 (two) times daily.      STOP taking these medications     aspirin-sod bicarb-citric acid (ALKA-SELTZER) 325 MG TBEF tablet          Discharge Condition: Stable Discharge Instructions Get Medicines reviewed and adjusted: Please take all your medications with you for your next visit with your Primary MD  Please request your Primary MD to go over all hospital tests and procedure/radiological results at the follow up, please ask your Primary MD to get all Hospital records sent to  his/her office.  If you experience worsening of your admission symptoms, develop shortness of breath, life threatening emergency, suicidal or homicidal thoughts you must seek medical attention immediately by calling 911 or calling your MD immediately if symptoms less severe.  You must read complete instructions/literature along with all the possible adverse reactions/side effects for all the Medicines you take and that have been prescribed to you. Take any new Medicines after you have completely understood and accpet all the possible adverse reactions/side effects.   Do not drive when taking Pain medications.   Do not take more than prescribed Pain, Sleep and Anxiety Medications  Special Instructions: If you have smoked or chewed Tobacco in the last 2 yrs please stop smoking, stop any regular Alcohol and or any Recreational drug use.  Wear Seat belts while driving.  Please note  You were cared for by a hospitalist during your hospital stay. Once you are discharged, your primary care physician will handle any further medical issues. Please note that NO REFILLS for any discharge medications will be authorized once you are discharged, as it is imperative that you return to your primary care physician (or establish a relationship with a primary care physician if you do not have one) for your aftercare needs so that they can reassess your need for medications and monitor your lab values.     No Known Allergies  Disposition: 01-Home or Self Care   Consults:  GI Vascular surgery     Significant Diagnostic Studies:  Dg Chest 2 View  Result Date: 12/17/2015 CLINICAL DATA:  Pain EXAM: CHEST  2 VIEW COMPARISON:  12/07/2015 FINDINGS: The heart size and mediastinal contours are within normal limits. Both lungs are clear. The visualized skeletal structures are unremarkable. IMPRESSION: No active cardiopulmonary disease. Electronically Signed   By: Kerby Moors M.D.   On: 12/17/2015  21:15   Nm Hepato W/eject Fract  Result Date: 12/20/2015 CLINICAL DATA:  Epigastric pain for 5 days. EXAM: NUCLEAR MEDICINE HEPATOBILIARY IMAGING WITH GALLBLADDER EF TECHNIQUE: Sequential images of the abdomen were obtained out to 60 minutes following intravenous administration of radiopharmaceutical. After oral ingestion of Ensure, gallbladder ejection fraction was determined. At 60 min, normal ejection fraction is greater than 33%. RADIOPHARMACEUTICALS:  5.1 mCi Tc-31m Choletec IV COMPARISON:  None. FINDINGS: Prompt uptake and biliary excretion of activity by the liver is seen. Gallbladder activity is visualized, consistent with patency of cystic duct. Biliary activity passes into small bowel, consistent with patent common bile duct. Calculated gallbladder ejection fraction is 95%. (Normal gallbladder ejection fraction with Ensure is greater than 33%.) IMPRESSION: Normal filling of the gallbladder. Normal ejection fraction of 95% after a fatty meal. Electronically Signed   By: DDorise BullionIII M.D   On: 12/20/2015 18:40   Ct Angio Chest/abd/pel For Dissection W And/or Wo Contrast  Addendum Date: 12/18/2015   ADDENDUM REPORT: 12/18/2015 00:42 ADDENDUM: Additional comparison is made to the CT of the abdomen pelvis dated 12/15/2015. Indistinct aortic wall and periaortic fat is similar to the study dated 12/15/2015 and may be related to chronic inflammation or scarring or represent a more acute process. Clinical correlation is recommended. No definite active extravasation of contrast identified. Electronically Signed   By: AAnner CreteM.D.   On: 12/18/2015 00:42   Result Date: 12/18/2015 CLINICAL DATA:  65year old female with epigastric radiating to the back. EXAM: CT ANGIOGRAPHY CHEST, ABDOMEN AND PELVIS TECHNIQUE: Multidetector CT imaging through the chest, abdomen and pelvis was performed using the standard protocol during bolus administration of intravenous contrast. Multiplanar reconstructed  images and MIPs were obtained and reviewed to evaluate the vascular anatomy. CONTRAST:  Its 100 cc Isovue 370 COMPARISON:  Chest CT dated 03/11/2015 and abdominal CT dated 11/15/2012 FINDINGS: CTA CHEST FINDINGS There is mild centrilobular and paraseptal emphysema. The lungs are clear. There is no pleural effusion or pneumothorax. The central airways are patent. There is mild atherosclerotic calcification of thoracic aorta. There is no aneurysmal dilatation or evidence of dissection. The origins of the great vessels of the aortic arch appear patent. There is no CT evidence of pulmonary embolism. A top-normal cardiac size. There is coronary vascular calcification. There is no pericardial effusion. Multiple top-normal mediastinal lymph node in the perivascular space, cardiopulmonary window, and right paratracheal region of indeterminate clinical significance. There is no hilar adenopathy. The esophagus is grossly unremarkable. No thyroid nodules identified. There is no axillary adenopathy. The chest wall soft tissues appear unremarkable. There is osteopenia with degenerative changes of spine. No acute fracture. Review of the MIP images confirms the above findings. CTA ABDOMEN AND PELVIS FINDINGS No intra-abdominal free air. Small hypoattenuating area in the right posterior hemipelvis may represent ovarian tissue versus trace free fluid. The liver is, gallbladder, pancreas, spleen, and adrenal glands appear unremarkable. The kidneys, visualized ureters, and urinary bladder appear unremarkable. There is apparent diffuse thickening of the bladder wall which  may be partly related to underdistention. Cystitis is not excluded. Correlation with urinalysis recommended. There is redundancy of the sigmoid colon in. There is no evidence of bowel obstruction or active inflammation. Normal appendix. There is advanced aortoiliac atherosclerotic disease. There are diffuse noncalcified plaques versus less likely mural hematoma along  the course of the abdominal aorta and common iliac arteries. There is no aortic dissection. There is a fusiform infrarenal abdominal aortic aneurysm measuring up to 4.3 cm in greatest transverse axial diameter, increased in size since the prior CT of 11/15/2012 where it measured approximately 3.7 cm. There is partial thrombosis of the aneurysmal lumen chest above the bifurcation of the aorta. There is no extravasation of contrast or evidence of active hemorrhage. However, there is diffuse thickened appearance of the aortic wall and aneurysmal sac with infiltration of the immediate periaortic fat. Therefore mild intramural hemorrhage or mild aortic leak with potential for impending rupture is not excluded. Clinical correlation and vascular surgery consult is advised. The origins of the celiac axis, SMA, IMA as well as the origins of the renal arteries appear patent. There is a standard celiac axis branching anatomy. There are aneurysmal dilatation of the common iliac arteries measuring approximately 2.5 cm in diameter. The external and internal iliac arteries appear patent. No portal venous gas identified. There is no adenopathy. The abdominal wall soft tissues appear unremarkable. There is mild degenerative changes of the spine. No acute fracture. Review of the MIP images confirms the above findings. IMPRESSION: No CT evidence of thoracic aortic aneurysm or dissection. Fusiform infrarenal abdominal aortic aneurysm measuring up to 4.3 cm in axial diameter and increased since the study dated 2014 as well as aneurysmal dilatation of the common iliac arteries. No active extravasation of contrast identified. However, there is diffusely thickened appearance of the wall of the aorta with infiltrated appearance of the periaortic fat concerning for mural hemorrhage or mild periaortic leak. An impending rupture is not excluded. Correlation with clinical exam and vascular surgery consult is advised. 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 These results were called by telephone at the time of interpretation on 12/18/2015 at 12:20 am to Dr. Mattie Marlin , who verbally acknowledged these results. Electronically Signed: By: Elgie Collard M.D. On: 12/18/2015 00:22   US Abdomen Limited Ruq  Result Date: 12/18/2015 CLINICAL DATA:  Acute onset of epigastric abdominal pain and constipation. Initial encounter. EXAM: US ABDOMEN LIMITED - RIGHT UPPER QUADRANT COMPARISON:  CT of the abdomen and pelvis from 11/15/2012 FINDINGS: Gallbladder: No gallstones or wall thickening visualized. No sonographic Murphy sign noted by sonographer. Common bile duct: Diameter: 0.4 cm, within normal limits in caliber. Liver: No focal lesion identified. Mildly heterogeneous parenchymal echogenicity may reflect fatty infiltration. IMPRESSION: 1. No acute abnormality seen at the right upper quadrant. 2. Suggestion of mild fatty infiltration within the liver. Electronically Signed   By: Roanna Raider M.D.   On: 12/18/2015 03:48       Filed Weights   12/18/15 0440  Weight: 81 kg (178 lb 9.2 oz)     Microbiology: Recent Results (from the past 240 hour(s))  MRSA PCR Screening     Status: None   Collection Time: 12/18/15  6:35 AM  Result Value Ref Range Status   MRSA by PCR NEGATIVE NEGATIVE Final    Comment:        The GeneXpert MRSA Assay (FDA approved for NASAL specimens only), is one component of a comprehensive MRSA colonization  surveillance program. It is not intended to diagnose MRSA infection nor to guide or monitor treatment for MRSA infections.        Blood Culture    Component Value Date/Time   SDES URINE, RANDOM 11/15/2012 2212   SPECREQUEST NONE 11/15/2012 2212   CULT  11/15/2012 2212    INSIGNIFICANT GROWTH Performed at [place] 11/17/2012 FINAL 11/15/2012 2212      Labs: Results for orders placed or performed during the hospital  encounter of 12/17/15 (from the past 48 hour(s))  Basic metabolic panel     Status: Abnormal   Collection Time: 12/20/15  3:52 AM  Result Value Ref Range   Sodium 139 135 - 145 mmol/L   Potassium 3.8 3.5 - 5.1 mmol/L   Chloride 103 101 - 111 mmol/L   CO2 26 22 - 32 mmol/L   Glucose, Bld 87 65 - 99 mg/dL   BUN <5 (L) 6 - 20 mg/dL   Creatinine, Ser 0.70 0.44 - 1.00 mg/dL   Calcium 9.5 8.9 - 10.3 mg/dL   GFR calc non Af Amer >60 >60 mL/min   GFR calc Af Amer >60 >60 mL/min    Comment: (NOTE) The eGFR has been calculated using the CKD EPI equation. This calculation has not been validated in all clinical situations. eGFR's persistently <60 mL/min signify possible Chronic Kidney Disease.    Anion gap 10 5 - 15  CBC     Status: Abnormal   Collection Time: 12/20/15  3:52 AM  Result Value Ref Range   WBC 7.9 4.0 - 10.5 K/uL   RBC 4.48 3.87 - 5.11 MIL/uL   Hemoglobin 12.0 12.0 - 15.0 g/dL   HCT 36.2 36.0 - 46.0 %   MCV 80.8 78.0 - 100.0 fL   MCH 26.8 26.0 - 34.0 pg   MCHC 33.1 30.0 - 36.0 g/dL   RDW 16.0 (H) 11.5 - 15.5 %   Platelets 376 150 - 400 K/uL  Basic metabolic panel     Status: Abnormal   Collection Time: 12/21/15  3:38 AM  Result Value Ref Range   Sodium 136 135 - 145 mmol/L   Potassium 3.6 3.5 - 5.1 mmol/L   Chloride 101 101 - 111 mmol/L   CO2 26 22 - 32 mmol/L   Glucose, Bld 98 65 - 99 mg/dL   BUN <5 (L) 6 - 20 mg/dL   Creatinine, Ser 0.70 0.44 - 1.00 mg/dL   Calcium 9.2 8.9 - 10.3 mg/dL   GFR calc non Af Amer >60 >60 mL/min   GFR calc Af Amer >60 >60 mL/min    Comment: (NOTE) The eGFR has been calculated using the CKD EPI equation. This calculation has not been validated in all clinical situations. eGFR's persistently <60 mL/min signify possible Chronic Kidney Disease.    Anion gap 9 5 - 15     Lipid Panel     Component Value Date/Time   CHOL 139 01/23/2012 0812   TRIG 58 01/23/2012 0812   HDL 42 01/23/2012 0812   CHOLHDL 3.3 01/23/2012 0812   VLDL 12  01/23/2012 0812   LDLCALC 85 01/23/2012 0812     No results found for: HGBA1C   Lab Results  Component Value Date   LDLCALC 85 01/23/2012   CREATININE 0.70 12/21/2015     HPI :  65 y.o. female with medical history significant of AAA which is known to be 4.3 cm in size, GERD, h/o colon polyps, HTN.  Patient presents to the ED with c/o upper abdominal discomfort.  Pain is located in epigastric area, is episodic with "spasms" of pain.  These have been very short episodes, sharp, and do not radiate anywhere.She does have a history of an abdominal aortic aneurysm and is being followed at [facility] with yearly ultrasounds. She has been seen in consultation here by Dr. early who is not convinced that her pain is related to the abdominal aortic aneurysm. She states that she had an EGD and colonoscopy in [place] in June of this year.   HOSPITAL COURSE:    Intractable abdominal patient, Abdominal aortic aneurism GERD Extensive workup was undertaken. Patient had EGD on 9/11 that showed small hiatal hernia, otherwise normal, continue PPI CT angiogram showed abdominal aortic aneurysm no other etiology Vascular surgery, GI were consulted -HIDA scan today was negative, patient will be   discharged home with vascular follow-up -aggressive BP control  At discharge, will need to f/u with Dr. Oletta Lamas at [facility] for AAA.  Hypertension Continue home medications Needs close PCP follow-up for better management of her blood pressure  Unintentional weight loss EGD negative. CTA chest abdomen and pelvis negative for malignancy. Albumin slightly low Continue age appropriate screening   Discharge Exam:   Blood pressure 140/76, pulse (!) 56, temperature 98.7 F (37.1 C), temperature source Oral, resp. rate 19, height '5\' 4"'$  (1.626 m), weight 81 kg (178 lb 9.2 oz), SpO2 97 %. Cardiovascular: Regular rate and rhythm, no murmurs / rubs / gallops. No extremity  edema. 2+ pedal pulses. No carotid bruits.  Abdomen: no tenderness, no masses palpated. No hepatosplenomegaly. Bowel sounds positive.  Musculoskeletal: no clubbing / cyanosis. No joint deformity upper and lower extremities. Good ROM, no contractures. Normal muscle tone.  Skin: no rashes, lesions, ulcers. No induration Neurologic: CN 2-12 grossly intact. Sensation intact, DTR normal. Strength 5/5 in all 4.  Psychiatric: Normal judgment and insight. Alert and oriented x 3. Normal mood.     Follow-up Information    CABEZA,YURI, MD. Schedule an appointment as soon as possible for a visit today.   Specialty:  Internal Medicine Why:  Hospital follow-up Contact information: [address] [place] [address] [phone]           Signed: Reyne Dumas 12/21/2015, 7:53 AM        Time spent >45 mins

## 2015-12-21 NOTE — Progress Notes (Signed)
Pt discharged per MD order. All discharge instructions reviewed and all questions answered.  

## 2017-05-23 ENCOUNTER — Emergency Department (HOSPITAL_COMMUNITY): Payer: Medicare Other

## 2017-05-23 ENCOUNTER — Other Ambulatory Visit: Payer: Self-pay

## 2017-05-23 ENCOUNTER — Emergency Department (HOSPITAL_COMMUNITY)
Admission: EM | Admit: 2017-05-23 | Discharge: 2017-05-24 | Disposition: A | Discharge status: Home / Self Care | Payer: Medicare Other | Attending: Emergency Medicine | Admitting: Emergency Medicine

## 2017-05-23 ENCOUNTER — Encounter (HOSPITAL_COMMUNITY): Payer: Self-pay | Admitting: Emergency Medicine

## 2017-05-23 DIAGNOSIS — R1030 Lower abdominal pain, unspecified: Secondary | ICD-10-CM | POA: Diagnosis present

## 2017-05-23 DIAGNOSIS — I10 Essential (primary) hypertension: Secondary | ICD-10-CM | POA: Insufficient documentation

## 2017-05-23 DIAGNOSIS — Z79899 Other long term (current) drug therapy: Secondary | ICD-10-CM | POA: Insufficient documentation

## 2017-05-23 DIAGNOSIS — R1084 Generalized abdominal pain: Secondary | ICD-10-CM | POA: Diagnosis not present

## 2017-05-23 DIAGNOSIS — Z87891 Personal history of nicotine dependence: Secondary | ICD-10-CM | POA: Diagnosis not present

## 2017-05-23 DIAGNOSIS — E785 Hyperlipidemia, unspecified: Secondary | ICD-10-CM | POA: Insufficient documentation

## 2017-05-23 DIAGNOSIS — R109 Unspecified abdominal pain: Secondary | ICD-10-CM

## 2017-05-23 LAB — CBC
HCT: 31.1 % — ABNORMAL LOW (ref 36.0–46.0)
HEMOGLOBIN: 10.6 g/dL — AB (ref 12.0–15.0)
MCH: 28.3 pg (ref 26.0–34.0)
MCHC: 34.1 g/dL (ref 30.0–36.0)
MCV: 83.2 fL (ref 78.0–100.0)
PLATELETS: 401 10*3/uL — AB (ref 150–400)
RBC: 3.74 MIL/uL — AB (ref 3.87–5.11)
RDW: 16.1 % — ABNORMAL HIGH (ref 11.5–15.5)
WBC: 12.4 10*3/uL — ABNORMAL HIGH (ref 4.0–10.5)

## 2017-05-23 LAB — URINALYSIS, ROUTINE W REFLEX MICROSCOPIC
BILIRUBIN URINE: NEGATIVE
Bacteria, UA: NONE SEEN
GLUCOSE, UA: NEGATIVE mg/dL
Ketones, ur: NEGATIVE mg/dL
Leukocytes, UA: NEGATIVE
NITRITE: NEGATIVE
Protein, ur: NEGATIVE mg/dL
SPECIFIC GRAVITY, URINE: 1.01 (ref 1.005–1.030)
pH: 7 (ref 5.0–8.0)

## 2017-05-23 LAB — COMPREHENSIVE METABOLIC PANEL
ALK PHOS: 61 U/L (ref 38–126)
ALT: 9 U/L — ABNORMAL LOW (ref 14–54)
ANION GAP: 12 (ref 5–15)
AST: 12 U/L — ABNORMAL LOW (ref 15–41)
Albumin: 3.9 g/dL (ref 3.5–5.0)
BUN: 9 mg/dL (ref 6–20)
CALCIUM: 9.5 mg/dL (ref 8.9–10.3)
CO2: 24 mmol/L (ref 22–32)
Chloride: 105 mmol/L (ref 101–111)
Creatinine, Ser: 0.83 mg/dL (ref 0.44–1.00)
GFR calc non Af Amer: >60 mL/min (ref >60)
Glucose, Bld: 110 mg/dL — ABNORMAL HIGH (ref 65–99)
Potassium: 3.2 mmol/L — ABNORMAL LOW (ref 3.5–5.1)
Sodium: 141 mmol/L (ref 135–145)
TOTAL PROTEIN: 7 g/dL (ref 6.5–8.1)
Total Bilirubin: 0.5 mg/dL (ref 0.3–1.2)

## 2017-05-23 LAB — LIPASE, BLOOD: Lipase: 29 U/L (ref 11–51)

## 2017-05-23 MED ORDER — HYDROMORPHONE HCL 1 MG/ML IJ SOLN
0.5000 mg | INTRAMUSCULAR | Status: DC | PRN
  Administered 2017-05-23 – 2017-05-24 (×2): 0.5 mg via INTRAVENOUS
  Filled 2017-05-23 (×2): qty 1

## 2017-05-23 MED ORDER — ONDANSETRON HCL 4 MG/2ML IJ SOLN
4.0000 mg | Freq: Once | INTRAMUSCULAR | Status: AC
  Administered 2017-05-23: 4 mg via INTRAVENOUS
  Filled 2017-05-23: qty 2

## 2017-05-23 MED ORDER — OXYCODONE-ACETAMINOPHEN 5-325 MG PO TABS
1.0000 | ORAL_TABLET | Freq: Once | ORAL | Status: AC
  Administered 2017-05-23: 1 via ORAL
  Filled 2017-05-23: qty 1

## 2017-05-23 MED ORDER — IOPAMIDOL (ISOVUE-370) INJECTION 76%
INTRAVENOUS | Status: AC
  Administered 2017-05-23: 100 mL
  Filled 2017-05-23: qty 100

## 2017-05-23 NOTE — ED Notes (Signed)
Patient transported to CT 

## 2017-05-23 NOTE — ED Provider Notes (Signed)
[facility] EMERGENCY DEPARTMENT Provider Note   CSN: [id number] Arrival date & time: 05/23/17  1842     History   Chief Complaint Chief Complaint  Patient presents with  . Emesis  . Flank Pain    HPI Briana Hernandez is a 67 y.o. female.  HPI Patient presents to the emergency room for evaluation of severe lower abdominal pain.  Patient states initially she started having pain in her left flank for the last week or so.  She saw her doctor and states she had a urinalysis showing blood.  They did an ultrasound of her kidneys today but she does not know the results.  Patient started having trouble with nausea and vomiting but has not had any diarrhea.  The pain has migrated more towards the front of her abdomen and towards the left lower quadrant.  She does have a known history of abdominal aortic aneurysms that they are monitoring. Past Medical History:  Diagnosis Date  . AAA (abdominal aortic aneurysm) (HCC)   . GERD (gastroesophageal reflux disease)   . Hidradenitis suppurativa 12/21/2011  . History of colon polyps 04/10/11?   [facility]--Dr Noe Gens  . Hyperlipidemia   . Hypertension   . Microscopic hematuria    had neg cystoscopy  1/08 Dr Sabino Gasser  . OSA (obstructive sleep apnea) 05/21/2012    Patient Active Problem List   Diagnosis Date Noted  . Intractable abdominal pain 12/18/2015  . SOB (shortness of breath) on exertion 05/21/2012  . Urinary incontinence 05/21/2012  . OSA (obstructive sleep apnea) 05/21/2012  . Osteoarthritis 05/21/2012  . Routine general medical examination at a health care facility 01/04/2012  . Hidradenitis suppurativa 12/21/2011  . HTN (hypertension) 12/21/2011  . Hyperlipidemia 12/21/2011  . GERD (gastroesophageal reflux disease) 12/21/2011  . Hx of colonic polyps 12/21/2011  . AAA (abdominal aortic aneurysm) (HCC) 12/21/2011    Past Surgical History:  Procedure Laterality Date  . ESOPHAGOGASTRODUODENOSCOPY  (EGD) WITH PROPOFOL N/A 12/19/2015   Procedure: ESOPHAGOGASTRODUODENOSCOPY (EGD) WITH PROPOFOL;  Surgeon: Vida Rigger, MD;  [facility];  Service: Endoscopy;  Laterality: N/A;  . TUBAL LIGATION  1977    OB History    No data available       Home Medications    Prior to Admission medications   Medication Sig Start Date End Date Taking? Authorizing Provider  atorvastatin (LIPITOR) 80 MG tablet Take 1 tablet (80 mg total) by mouth daily. 12/21/11   Sandford Craze, NP  benazepril (LOTENSIN) 40 MG tablet Take 40 mg by mouth 2 (two) times daily. 08/29/15   [provider]  carvedilol (COREG) 25 MG tablet Take 12.5 mg by mouth 2 (two) times daily with a meal.    [provider]  escitalopram (LEXAPRO) 10 MG tablet Take 10 mg by mouth every morning. 11/23/15 02/21/16  [provider]  ferrous sulfate 325 (65 FE) MG EC tablet Take 325 mg by mouth 2 (two) times daily. 12/09/15 01/08/16  [provider]  lidocaine (LIDODERM) 5 % Place 1 patch onto the skin daily. Remove & Discard patch within 12 hours or as directed by MD 05/24/17   Linwood Dibbles, MD  pantoprazole (PROTONIX) 40 MG tablet Take 40 mg by mouth 2 (two) times daily. 07/28/15   [provider]  polyethylene glycol (MIRALAX / GLYCOLAX) packet Take 17 g by mouth daily. 12/21/15   Richarda Overlie, MD  Potassium Chloride ER 20 MEQ TBCR Take 20 mEq by mouth 2 (two) times  daily. 12/07/15   [provider]  traMADol (ULTRAM) 50 MG tablet Take 1 tablet (50 mg total) by mouth every 6 (six) hours as needed. 05/24/17   Linwood Dibbles, MD    Family History Family History  Problem Relation Age of Onset  . Diabetes Son        type I  . Leukemia Maternal Aunt   . Stroke Maternal Grandmother   . Cancer Cousin        lung?  . Heart disease Maternal Uncle   . Hypertension Maternal Uncle   . Diabetes Maternal Uncle     Social History Social History   Tobacco Use  . Smoking status: Former  Smoker    Packs/day: 1.00    Years: 35.00    Pack years: 35.00    Types: Cigarettes    Last attempt to quit: 01/29/2012    Years since quitting: 5.3  . Smokeless tobacco: Never Used  Substance Use Topics  . Alcohol use: Yes    Alcohol/week: 7.2 oz    Types: 12 Cans of beer per week  . Drug use: Not on file     Allergies   Patient has no known allergies.   Review of Systems Review of Systems  All other systems reviewed and are negative.    Physical Exam Updated Vital Signs BP (!) 203/89   Pulse (!) 57   Temp 98.9 F (37.2 C) (Oral)   Resp 20   Ht 1.626 m (5\' 4" )   Wt 82.6 kg (182 lb)   SpO2 100%   BMI 31.24 kg/m   Physical Exam  Constitutional: She appears distressed.  HENT:  Head: Normocephalic and atraumatic.  Right Ear: External ear normal.  Left Ear: External ear normal.  Eyes: Conjunctivae are normal. Right eye exhibits no discharge. Left eye exhibits no discharge. No scleral icterus.  Neck: Neck supple. No tracheal deviation present.  Cardiovascular: Normal rate, regular rhythm and intact distal pulses.  Pulmonary/Chest: Effort normal and breath sounds normal. No stridor. No respiratory distress. She has no wheezes. She has no rales.  Abdominal: Soft. Bowel sounds are normal. She exhibits no distension and no pulsatile midline mass. There is generalized tenderness and tenderness in the left upper quadrant. There is no rigidity, no rebound and no guarding. No hernia.  Musculoskeletal: She exhibits no edema or tenderness.  Neurological: She is alert. She has normal strength. No cranial nerve deficit (no facial droop, extraocular movements intact, no slurred speech) or sensory deficit. She exhibits normal muscle tone. She displays no seizure activity. Coordination normal.  Skin: Skin is warm and dry. No rash noted.  Psychiatric: She has a normal mood and affect.  Nursing note and vitals reviewed.    ED Treatments / Results  Labs (all labs ordered are  listed, but only abnormal results are displayed) Labs Reviewed  COMPREHENSIVE METABOLIC PANEL - Abnormal; Notable for the following components:      Result Value   Potassium 3.2 (*)    Glucose, Bld 110 (*)    AST 12 (*)    ALT 9 (*)    All other components within normal limits  CBC - Abnormal; Notable for the following components:   WBC 12.4 (*)    RBC 3.74 (*)    Hemoglobin 10.6 (*)    HCT 31.1 (*)    RDW 16.1 (*)    Platelets 401 (*)    All other components within normal limits  URINALYSIS, ROUTINE W REFLEX MICROSCOPIC -  Abnormal; Notable for the following components:   Color, Urine STRAW (*)    Hgb urine dipstick SMALL (*)    Squamous Epithelial / LPF 0-5 (*)    All other components within normal limits  LIPASE, BLOOD    EKG  EKG Interpretation None       Radiology Ct Angio Abd/pel W And/or Wo Contrast  Result Date: 05/23/2017 CLINICAL DATA:  67 year old female with lower back pain, worse on the left x1 week. No abdominal aortic aneurysm. EXAM: CTA ABDOMEN AND PELVIS WITHOUT AND WITH CONTRAST TECHNIQUE: Multidetector CT imaging of the abdomen and pelvis was performed using the standard protocol during bolus administration of intravenous contrast. Multiplanar reconstructed images and MIPs were obtained and reviewed to evaluate the vascular anatomy. CONTRAST:  ISOVUE-370 IOPAMIDOL (ISOVUE-370) INJECTION 76% COMPARISON:  None. FINDINGS: VASCULAR Aorta: Redemonstration of infrarenal abdominal aortic aneurysm currently estimated 4.9 cm transverse by 4.5 cm AP versus 4.6 x 4.4 cm previously. Moderate degree of intraluminal soft plaque is noted along the posterior left lateral wall of the infrarenal aorta. No aneurysmal leak. Redemonstration of diffuse aortoiliac atherosclerotic calcifications. Celiac: Patent celiac axis. SMA: Atherosclerotic calcifications at the origin of the SMA without intraluminal thrombus or significant stenosis. Renals: Single renal arteries to both  kidneys with mild atherosclerotic calcifications at the origins of both renal arteries and proximal right renal artery. No significant stenosis. No aneurysm or evidence of fibromuscular dysplasia. IMA: Patent without evidence of aneurysm, dissection, vasculitis or significant stenosis. Inflow: Aneurysmal dilatation of the common iliac arteries measuring 2.6 cm on the right and 2.4 cm on the left with mild-to-moderate soft plaque in the proximal left common iliac artery and mild plaque within the right common iliac artery noted. Proximal Outflow: Patent with atherosclerotic calcifications. Veins: No obvious venous abnormality within the limitations of this arterial phase study. Review of the MIP images confirms the above findings. NON-VASCULAR Lower chest: Borderline cardiomegaly without pericardial effusion. Dependent atelectasis at the lung bases. Hepatobiliary: No focal liver abnormality is seen. No gallstones, gallbladder wall thickening, or biliary dilatation. Pancreas: Unremarkable. No pancreatic ductal dilatation or surrounding inflammatory changes. Spleen: Normal in size without focal abnormality. Adrenals/Urinary Tract: Adrenal glands are unremarkable. Kidneys are normal, without renal calculi, focal lesion, or hydronephrosis. Symmetric cortical enhancement. Bladder is unremarkable. Stomach/Bowel: Stomach is within normal limits. Appendix appears normal. No evidence of bowel wall thickening, distention, or inflammatory changes. Lymphatic: No adenopathy Reproductive: Uterus and bilateral adnexa are unremarkable. Other: Trace free fluid in the cul-de-sac. Musculoskeletal: No acute or significant osseous findings. IMPRESSION: VASCULAR 1. Redemonstration of infrarenal abdominal aortic aneurysm, slightly increased in caliber since prior currently estimated at 4.9 cm transverse by 4.5 cm AP versus 4.6 x 4.4 cm. Recommend followup by abdomen and pelvis CTA in 6 months, and vascular surgery referral/consultation if  not already obtained. This recommendation follows ACR consensus guidelines: White Paper of the ACR Incidental Findings Committee II on Vascular Findings. J Am Coll Radiol 2013; 10:789-794. 2. 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 3. Moderate degree of intraluminal soft plaque within the fusiform abdominal aortic aneurysm with more peripheral calcifications noted. 4. Patent branch vessels. NON-VASCULAR 1. Trace physiologic free fluid the cul-de-sac. 2. Stable cardiomegaly. Electronically Signed   By: Tollie Eth M.D.   On: 05/23/2017 23:45    Procedures Procedures (including critical care time)  Medications Ordered in ED Medications  HYDROmorphone (DILAUDID) injection 0.5 mg (0.5 mg Intravenous Given  05/23/17 2205)  amLODipine (NORVASC) tablet 5 mg (not administered)  oxyCODONE-acetaminophen (PERCOCET/ROXICET) 5-325 MG per tablet 1 tablet (1 tablet Oral Given 05/23/17 1916)  ondansetron (ZOFRAN) injection 4 mg (4 mg Intravenous Given 05/23/17 2205)  iopamidol (ISOVUE-370) 76 % injection (100 mLs  Contrast Given 05/23/17 2239)     Initial Impression / Assessment and Plan / ED Course  I have reviewed the triage vital signs and the nursing notes.  Pertinent labs & imaging results that were available during my care of the patient were reviewed by me and considered in my medical decision making (see chart for details).  Clinical Course as of May 24 12  Thu May 23, 2017  2138 Outpatient renal us reviewed, no acute findings.  Abd pain is concerning with her hx of known aneurysm.  Will ct to evaluate further  [JK]  Fri May 24, 2017  0011 Laboratory tests are reassuring.  [JK]  0013 Hemoglobin decreased at 10.6 however her hemoglobin was 10.9 last month [facility].  No significant change.  [JK]  0013 Laboratory tests are otherwise unremarkable.  The patient's CT scan does not show any acute  abnormalities.  [JK]    Clinical Course User Index [JK] Linwood DibblesKnapp, Jerrol Helmers, MD    Patient presented to the emergency room with complaints of severe lower abdominal pain.  Patient states initially her symptoms started as back pain.  She had seen her primary care doctor and with was treated for possible musculoskeletal back pain.  Patient presented to the emergency room because of the worsening symptoms this evening.  I was concerned about the possibility of diverticulitis as well as aneurysmal rupture.  CT Angio of the abdomen pelvis fortunately does not show any evidence of any acute changes in her aneurysm.  There is also no other signs of infection or other acute abnormality.  In retrospect I think her back pain may be related to a musculoskeletal etiology.  Plan on discharging home with pain medications.  Was also noted to be hypertensive.  I will give her an oral dose of Norvasc and have her follow-up with her primary care doctor.  Final Clinical Impressions(s) / ED Diagnoses   Final diagnoses:  Abdominal pain, unspecified abdominal location  Essential hypertension    ED Discharge Orders        Ordered    traMADol (ULTRAM) 50 MG tablet  Every 6 hours PRN     05/24/17 0010    lidocaine (LIDODERM) 5 %  Every 24 hours     05/24/17 0010       Linwood DibblesKnapp, Aleksandra Raben, MD 05/24/17 [phone]

## 2017-05-23 NOTE — ED Notes (Signed)
Daughter approaches NF, reports pt's abdominal pain is worsening; reports h/o of 3 abdominal aneurysms.

## 2017-05-23 NOTE — ED Notes (Signed)
Patient denies pain and is resting comfortably.  

## 2017-05-23 NOTE — ED Triage Notes (Signed)
Pt presents to ED for assessment of left and right lower back pain, worse on the left x 1 week.  Patient states she has had a urinalysis showing blood, with a US of Kidney, unsure of results.  Patient states pain has been worse since the US today, n/v, denies diarrhea.

## 2017-05-24 MED ORDER — TRAMADOL HCL 50 MG PO TABS: 50.0000 mg | ORAL_TABLET | Freq: Four times a day (QID) | ORAL | 0 refills | Status: DC | PRN

## 2017-05-24 MED ORDER — LIDOCAINE 5 % EX PTCH: 1.0000 | MEDICATED_PATCH | CUTANEOUS | 0 refills | Status: AC

## 2017-05-24 MED ORDER — AMLODIPINE BESYLATE 5 MG PO TABS
5.0000 mg | ORAL_TABLET | Freq: Once | ORAL | Status: DC
  Filled 2017-05-24: qty 1

## 2017-05-24 NOTE — Discharge Instructions (Signed)
Follow-up with your primary care doctor, take the medications as needed for pain.  Monitor for fever, worsening symptoms

## 2019-07-04 IMAGING — CT CT CTA ABD/PEL W/CM AND/OR W/O CM
2 of 12 series · 11 of 46 positions shown, 15 images · IV contrast (APPLIED)
Comparison: None.

CLINICAL DATA: 66-year-old female with lower back pain, worse on
the left x1 week. No abdominal aortic aneurysm.

EXAM:
CTA ABDOMEN AND PELVIS WITHOUT AND WITH CONTRAST
TECHNIQUE: Multidetector CT imaging of the abdomen and pelvis was performed
using the standard protocol during bolus administration of
intravenous contrast. Multiplanar reconstructed images and MIPs were
obtained and reviewed to evaluate the vascular anatomy.
CONTRAST:  100mL [id number] IOPAMIDOL ([id number]) INJECTION 76%

[Series 10: cor · coronal · 0.67mm/px · 2 of 151 slices shown]
[im 51/151  soft-tissue]
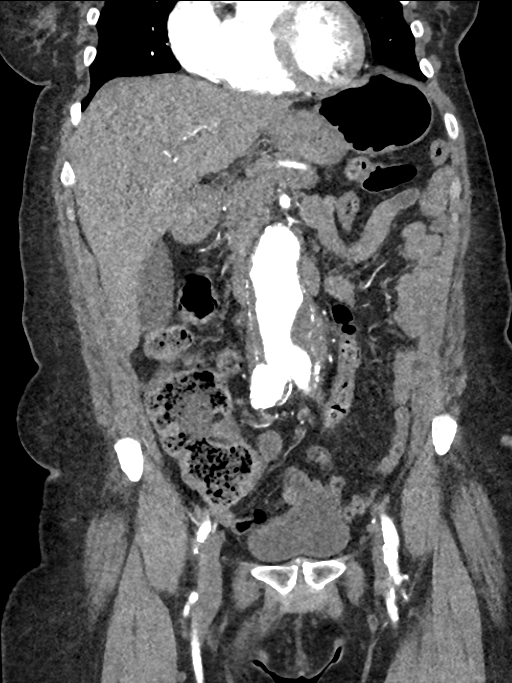
[im 101/151  soft-tissue]
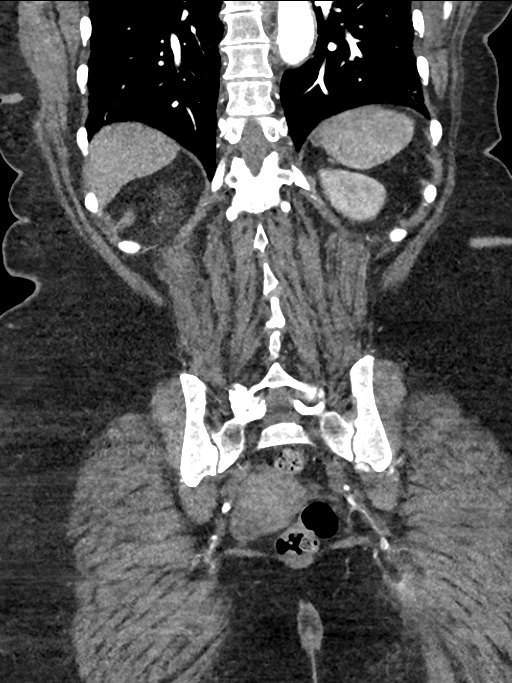

[Series 15: venous thins · axial · portal-venous · 0.98mm/px · z∈[+809,+1191]mm · 9 of 1147 slices shown, 13 images]
[im 96/1147  soft-tissue]
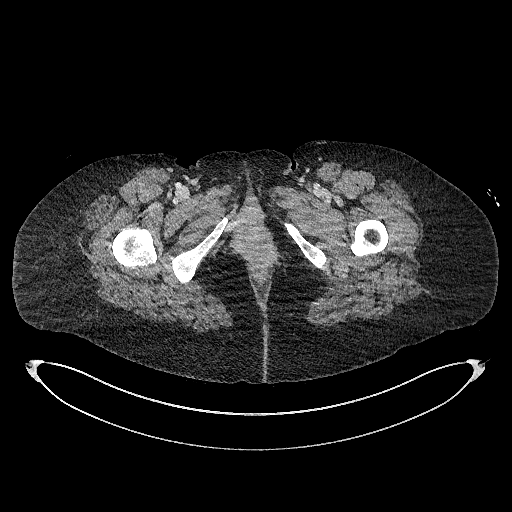
[im 96/1147  bone]
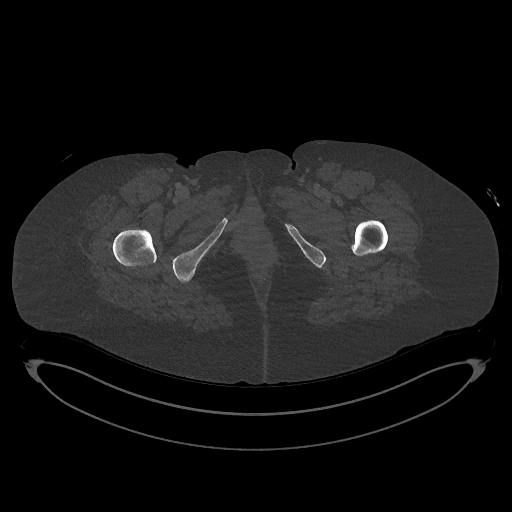
[im 287/1147  soft-tissue]
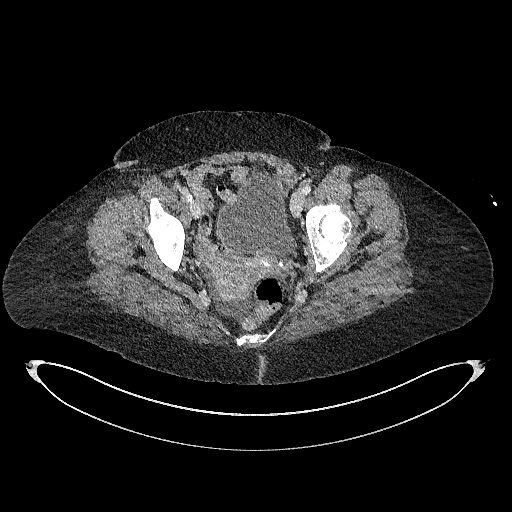
[im 383/1147  soft-tissue]
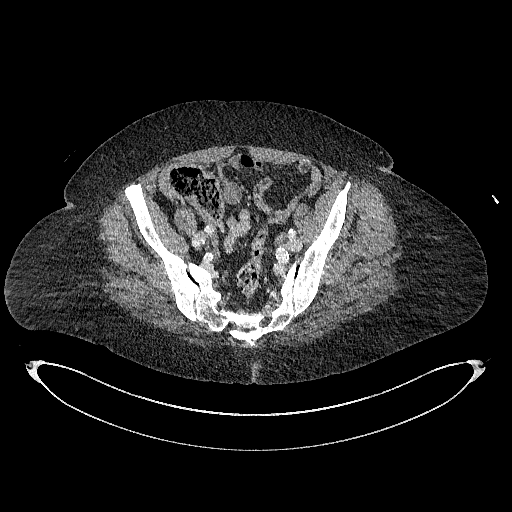
[im 478/1147  soft-tissue]
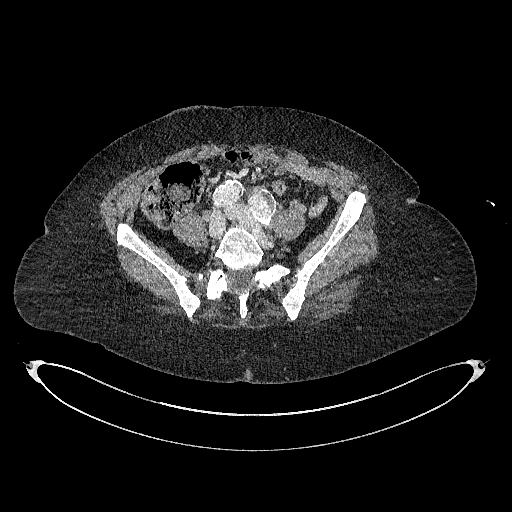
[im 669/1147  soft-tissue]
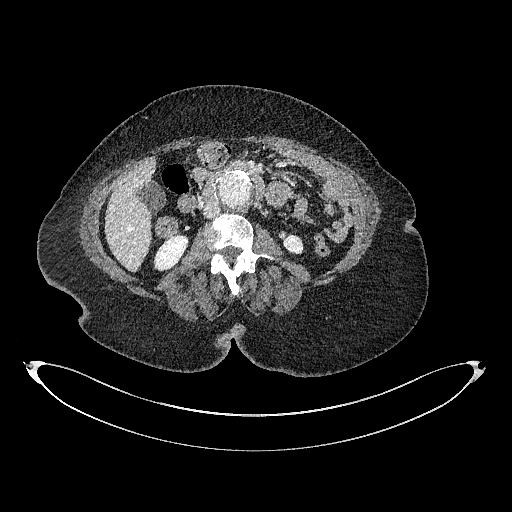
[im 765/1147  soft-tissue]
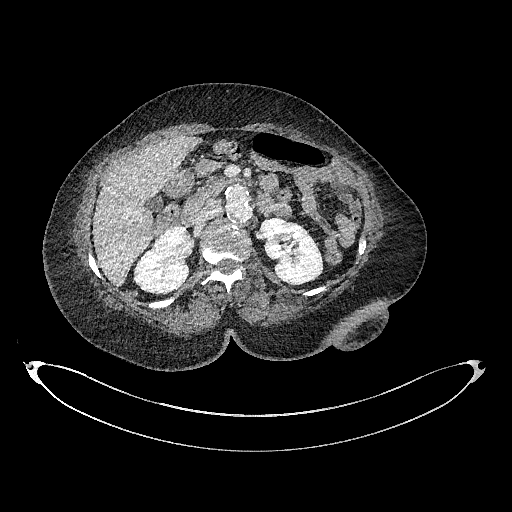
[im 765/1147  lung]
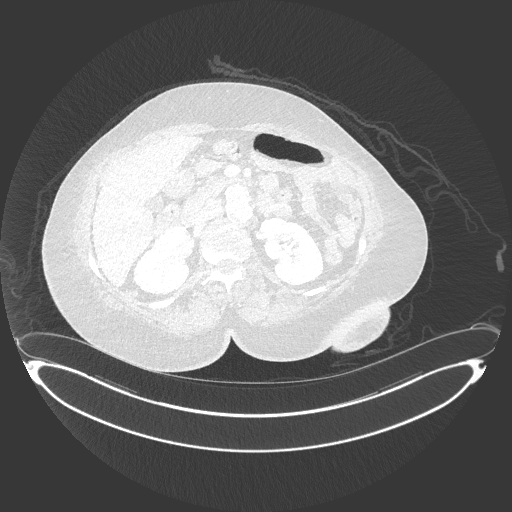
[im 860/1147  soft-tissue]
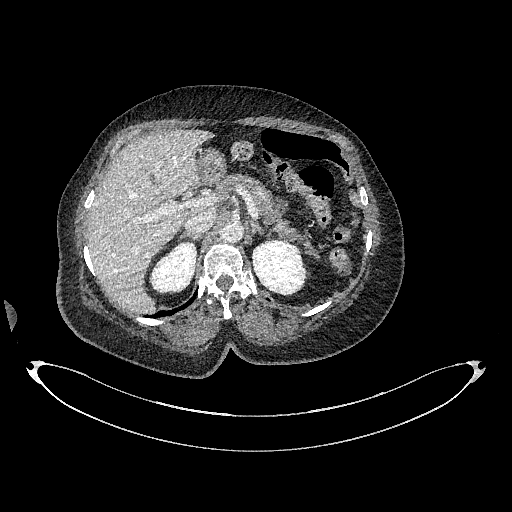
[im 860/1147  lung]
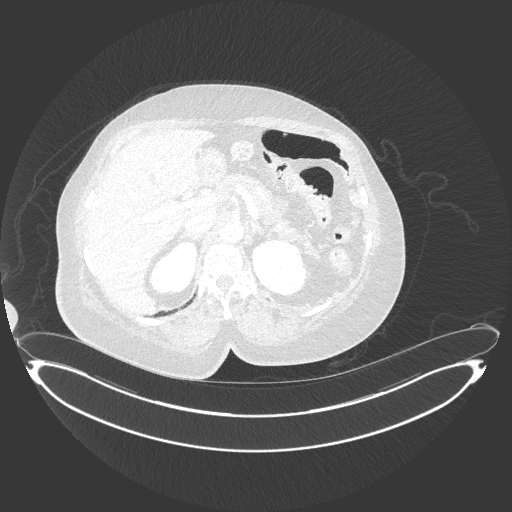
[im 956/1147  lung]
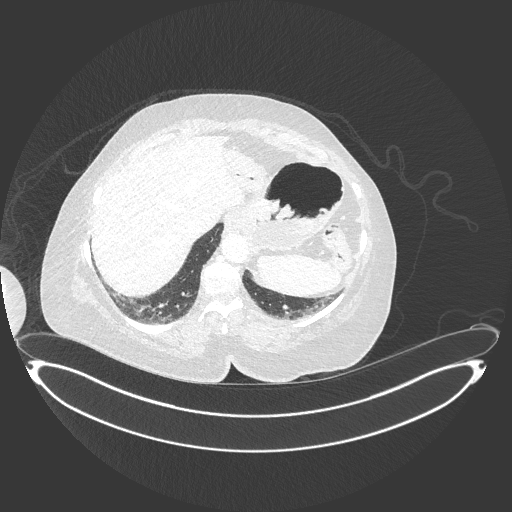
[im 1051/1147  soft-tissue]
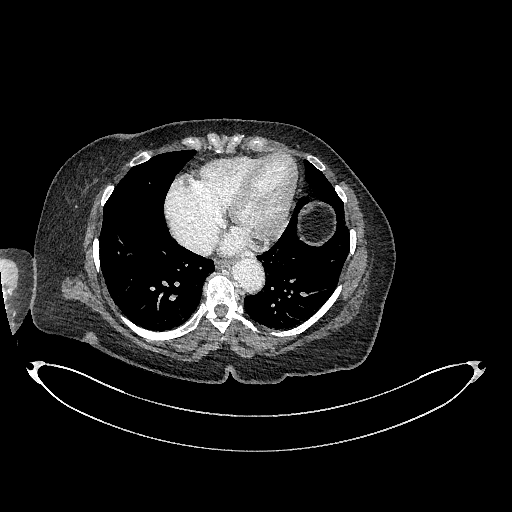
[im 1051/1147  lung]
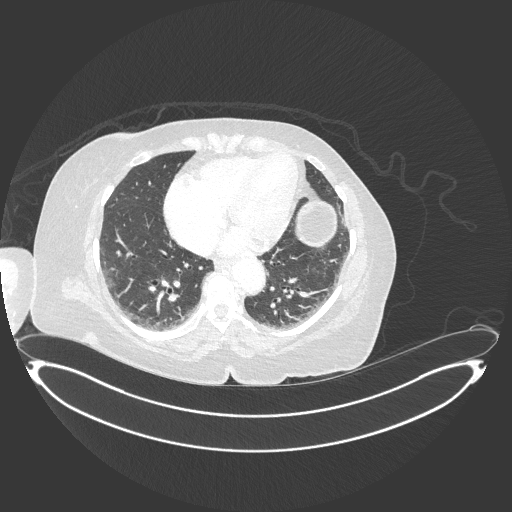

[11 of 46 positions shown; findings below may reference images not displayed]

FINDINGS: VASCULAR

Aorta: Redemonstration of infrarenal abdominal aortic aneurysm
currently estimated 4.9 cm transverse by 4.5 cm AP versus 4.6 x
cm previously. Moderate degree of intraluminal soft plaque is noted
along the posterior left lateral wall of the infrarenal aorta. No
aneurysmal leak. Redemonstration of diffuse aortoiliac
atherosclerotic calcifications.

Celiac: Patent celiac axis.

SMA: Atherosclerotic calcifications at the origin of the SMA without
intraluminal thrombus or significant stenosis.

Renals: Single renal arteries to both kidneys with mild
atherosclerotic calcifications at the origins of both renal arteries
and proximal right renal artery. No significant stenosis. No
aneurysm or evidence of fibromuscular dysplasia.

IMA: Patent without evidence of aneurysm, dissection, vasculitis or
significant stenosis.

Inflow: Aneurysmal dilatation of the common iliac arteries measuring
2.6 cm on the right and 2.4 cm on the left with mild-to-moderate
soft plaque in the proximal left common iliac artery and mild plaque
within the right common iliac artery noted.

Proximal Outflow: Patent with atherosclerotic calcifications.

Veins: No obvious venous abnormality within the limitations of this
arterial phase study.

Review of the MIP images confirms the above findings.

NON-VASCULAR

Lower chest: Borderline cardiomegaly without pericardial effusion.
Dependent atelectasis at the lung bases.

Hepatobiliary: No focal liver abnormality is seen. No gallstones,
gallbladder wall thickening, or biliary dilatation.

Pancreas: Unremarkable. No pancreatic ductal dilatation or
surrounding inflammatory changes.

Spleen: Normal in size without focal abnormality.

Adrenals/Urinary Tract: Adrenal glands are unremarkable. Kidneys are
normal, without renal calculi, focal lesion, or hydronephrosis.
Symmetric cortical enhancement. Bladder is unremarkable.

Stomach/Bowel: Stomach is within normal limits. Appendix appears
normal. No evidence of bowel wall thickening, distention, or
inflammatory changes.

Lymphatic: No adenopathy

Reproductive: Uterus and bilateral adnexa are unremarkable.

Other: Trace free fluid in the cul-de-sac.

Musculoskeletal: No acute or significant osseous findings.
IMPRESSION: VASCULAR

1. Redemonstration of infrarenal abdominal aortic aneurysm, slightly
increased in caliber since prior currently estimated at 4.9 cm
transverse by 4.5 cm AP versus 4.6 x 4.4 cm. Recommend followup by
abdomen and pelvis CTA in 6 months, and vascular surgery
referral/consultation if not already obtained. This recommendation
follows ACR consensus guidelines: White Paper of the ACR Incidental
Findings Committee II on Vascular Findings. [HOSPITAL] 0104;
[DATE].
2. Bilateral common iliac artery aneurysms measuring 2.6 cm on the
right and 2.4 cm on the left, stable.
3. Moderate degree of intraluminal soft plaque within the fusiform
abdominal aortic aneurysm with more peripheral calcifications noted.
4. Patent branch vessels.

NON-VASCULAR

1. Trace physiologic free fluid the cul-de-sac.
2. Stable cardiomegaly.

## 2021-08-12 ENCOUNTER — Other Ambulatory Visit: Payer: Self-pay

## 2021-08-12 ENCOUNTER — Encounter (HOSPITAL_BASED_OUTPATIENT_CLINIC_OR_DEPARTMENT_OTHER): Payer: Self-pay | Admitting: Emergency Medicine

## 2021-08-12 ENCOUNTER — Emergency Department (HOSPITAL_BASED_OUTPATIENT_CLINIC_OR_DEPARTMENT_OTHER)
Admission: EM | Admit: 2021-08-12 | Discharge: 2021-08-13 | Disposition: A | Discharge status: Home / Self Care | Payer: Medicare PPO | Attending: Emergency Medicine | Admitting: Emergency Medicine

## 2021-08-12 DIAGNOSIS — Z79899 Other long term (current) drug therapy: Secondary | ICD-10-CM | POA: Insufficient documentation

## 2021-08-12 DIAGNOSIS — Z87891 Personal history of nicotine dependence: Secondary | ICD-10-CM | POA: Diagnosis not present

## 2021-08-12 DIAGNOSIS — I10 Essential (primary) hypertension: Secondary | ICD-10-CM | POA: Insufficient documentation

## 2021-08-12 DIAGNOSIS — M79672 Pain in left foot: Secondary | ICD-10-CM | POA: Diagnosis not present

## 2021-08-12 NOTE — ED Notes (Signed)
MSE waiver explained. Signature pad not working.  ?

## 2021-08-12 NOTE — ED Triage Notes (Signed)
L foot pain and swelling since yesterday. Denies injury.  ?

## 2021-08-13 ENCOUNTER — Emergency Department (HOSPITAL_BASED_OUTPATIENT_CLINIC_OR_DEPARTMENT_OTHER): Payer: Medicare PPO

## 2021-08-13 MED ORDER — HYDROCODONE-ACETAMINOPHEN 5-325 MG PO TABS: 1.0000 | ORAL_TABLET | Freq: Four times a day (QID) | ORAL | 0 refills | Status: AC | PRN

## 2021-08-13 MED ORDER — MELOXICAM 15 MG PO TABS: ORAL_TABLET | ORAL | 0 refills | Status: AC

## 2021-08-13 NOTE — ED Notes (Signed)
Pt NAD, a/ox4. Pt verbalizes understanding of all DC and f/u instructions. All questions answered. Pt assisted into w/c and wheeled to lobby where she states will wait for ride.  ? ?

## 2021-08-13 NOTE — ED Provider Notes (Signed)
? ?[facility] ?Provider Note: Lowella Dell, MD, FACEP ? ?CSN: [id number] ?MRN: [id number] ?ARRIVAL: 08/12/21 at 2123 ?ROOM: [facility] ? ? ?CHIEF COMPLAINT  ?Foot Pain ? ? ?HISTORY OF PRESENT ILLNESS  ?08/13/21 12:56 AM ?Briana Hernandez is a 71 y.o. adult with left foot pain and swelling since the day before yesterday.  The pain is rated as a 10 out of 10, worse with weightbearing.  The pain is located along the medial aspect of the foot near the tarsal bones.  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 Sherilyn Cooter of podiatry.  The patient has taken meloxicam without adequate relief. ? ? ?Past Medical History:  ?Diagnosis Date  ? AAA (abdominal aortic aneurysm) (HCC)   ? GERD (gastroesophageal reflux disease)   ? Hidradenitis suppurativa 12/21/2011  ? History of colon polyps 04/10/11?  ? [facility]--Dr Noe Gens  ? Hyperlipidemia   ? Hypertension   ? Microscopic hematuria   ? had neg cystoscopy  1/08 Dr Sabino Gasser  ? OSA (obstructive sleep apnea) 05/21/2012  ? ? ?Past Surgical History:  ?Procedure Laterality Date  ? ESOPHAGOGASTRODUODENOSCOPY (EGD) WITH PROPOFOL N/A 12/19/2015  ? Procedure: ESOPHAGOGASTRODUODENOSCOPY (EGD) WITH PROPOFOL;  Surgeon: Vida Rigger, MD;  Location: [facility] END[facility]COPY;  Service: Endoscopy;  Laterality: N/A;  ? TUBAL LIGATION  1977  ? ? ?Family History  ?Problem Relation Age of Onset  ? Diabetes Son   ?     type I  ? Leukemia Maternal Aunt   ? Stroke Maternal Grandmother   ? Cancer Cousin   ?     lung?  ? Heart disease Maternal Uncle   ? Hypertension Maternal Uncle   ? Diabetes Maternal Uncle   ? ? ?Social History  ? ?Tobacco Use  ? Smoking status: Former  ?  Packs/day: 1.00  ?  Years: 35.00  ?  Pack years: 35.00  ?  Types: Cigarettes  ?  Quit date: 01/29/2012  ?  Years since quitting: 9.5  ? Smokeless tobacco: Never  ?Substance Use Topics  ? Alcohol use: Yes  ?  Alcohol/week: 12.0  standard drinks  ?  Types: 12 Cans of beer per week  ? ? ?Prior to Admission medications   ?Medication Sig Start Date End Date Taking? Authorizing Provider  ?HYDROcodone-acetaminophen (NORCO) 5-325 MG tablet Take 1 tablet by mouth every 6 (six) hours as needed for severe pain. 08/13/21  Yes Hiren Peplinski, MD  ?meloxicam (MOBIC) 15 MG tablet Take 1 tablet daily as needed for pain. 08/13/21  Yes Carlean Crowl, MD  ?atorvastatin (LIPITOR) 80 MG tablet Take 1 tablet (80 mg total) by mouth daily. 12/21/11   Sandford Craze, NP  ?benazepril (LOTENSIN) 40 MG tablet Take 40 mg by mouth 2 (two) times daily. 08/29/15   [provider]  ?carvedilol (COREG) 25 MG tablet Take 12.5 mg by mouth 2 (two) times daily with a meal.    [provider]  ?escitalopram (LEXAPRO) 10 MG tablet Take 10 mg by mouth every morning. 11/23/15 02/21/16  [provider]  ?ferrous sulfate 325 (65 FE) MG EC tablet Take 325 mg by mouth 2 (two) times daily. 12/09/15 01/08/16  [provider]  ?lidocaine (LIDODERM) 5 % Place 1 patch onto the skin daily. Remove & Discard patch within 12 hours or as directed by MD 05/24/17   Linwood Dibbles, MD  ?pantoprazole (PROTONIX) 40 MG tablet  Take 40 mg by mouth 2 (two) times daily. 07/28/15   [provider]  ?polyethylene glycol (MIRALAX / GLYCOLAX) packet Take 17 g by mouth daily. 12/21/15   Richarda Overlie, MD  ?Potassium Chloride ER 20 MEQ TBCR Take 20 mEq by mouth 2 (two) times daily. 12/07/15   [provider]  ? ? ?Allergies ?Amlodipine ? ? ?REVIEW OF SYSTEMS  ?Negative except as noted here or in the History of Present Illness. ? ? ?PHYSICAL EXAMINATION  ?Initial Vital Signs ?Blood pressure 122/63, pulse 64, temperature 98.3 ?F (36.8 ?C), temperature source Oral, resp. rate 18, height 5\' 4"  (1.626 m), weight 71.2 kg, SpO2 95 %. ? ?Examination ?General: Well-developed, well-nourished adult in no acute distress; appearance consistent with age of record ?HENT: normocephalic;  atraumatic ?Eyes: Normal appearance ?Neck: supple ?Heart: regular rate and rhythm ?Lungs: clear to auscultation bilaterally ?Abdomen: soft; nondistended; nontender; bowel sounds present ?Extremities: Chronic appearing deformity of distal left foot with tenderness along the medial aspect of the carpal bones: ? ? ? ?Neurologic: Awake, alert and oriented; motor function intact in all extremities and symmetric; no facial droop ?Skin: Warm and dry ?Psychiatric: Normal mood and affect ? ? ?RESULTS  ?Summary of this visit's results, reviewed and interpreted by myself: ? ? EKG Interpretation ? ?Date/Time:    ?Ventricular Rate:    ?PR Interval:    ?QRS Duration:   ?QT Interval:    ?QTC Calculation:   ?R Axis:     ?Text Interpretation:   ?  ? ?  ? ?Laboratory Studies: ?No results found for this or any previous visit (from the past 24 hour(s)). ?Imaging Studies: ?DG Foot Complete Left ? ?Result Date: 08/13/2021 ?CLINICAL DATA:  Pain over the medial midfoot.  No known injury. EXAM: LEFT FOOT - COMPLETE 3+ VIEW COMPARISON:  None Available. FINDINGS: There is no acute fracture or dislocation. The bones are osteopenic. Postsurgical changes with screw in the base of the first metatarsal. There is mild diffuse subcutaneous edema. IMPRESSION: 1. No acute fracture or dislocation. 2. Osteopenia with postsurgical changes of the first metatarsal. 3. Mild diffuse subcutaneous edema. Electronically Signed   By: 10/13/2021 M.D.   On: 08/13/2021 01:30   ? ?ED COURSE and MDM  ?Nursing notes, initial and subsequent vitals signs, including pulse oximetry, reviewed and interpreted by myself. ? ?Vitals:  ? 08/12/21 2131 08/12/21 2133 08/13/21 0026 08/13/21 0030  ?BP:  130/71 118/72 122/63  ?Pulse:  72 63 64  ?Resp:  18 18 18   ?Temp:  98.3 ?F (36.8 ?C)    ?TempSrc:  Oral    ?SpO2:  97% 98% 95%  ?Weight: 71.2 kg     ?Height: 5\' 4"  (1.626 m)     ? ?Medications - No data to display ? ?The patient's acute pain could represent gout although it is  an atypical location.  She was advised to continue the meloxicam and we will treat with a short course of narcotic analgesics and have her follow-up with Dr. 10/13/21. ? ?PROCEDURES  ?Procedures ? ? ?ED DIAGNOSES  ? ?  ICD-10-CM   ?1. Foot pain, left  M79.672   ?  ? ? ? ?  ?Buel Molder, MD ?08/13/21 0149 ? ?

## 2023-09-24 IMAGING — DX DG FOOT COMPLETE 3+V*L*
3 series · 3 of 3 positions shown · non-contrast
Comparison: None Available.

CLINICAL DATA: Pain over the medial midfoot.  No known injury.

EXAM:
LEFT FOOT - COMPLETE 3+ VIEW

[foot ap]
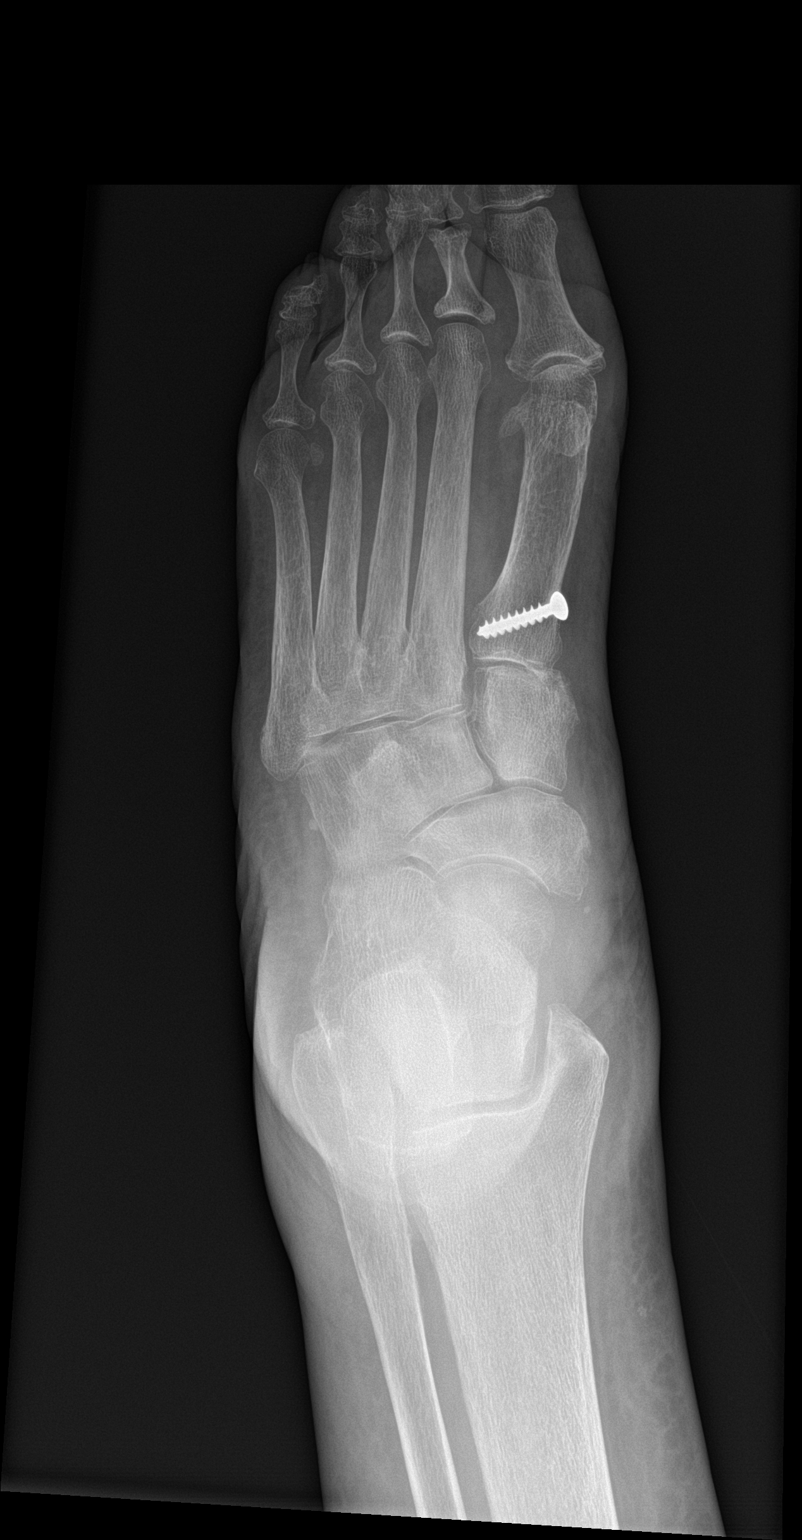

[foot obl]
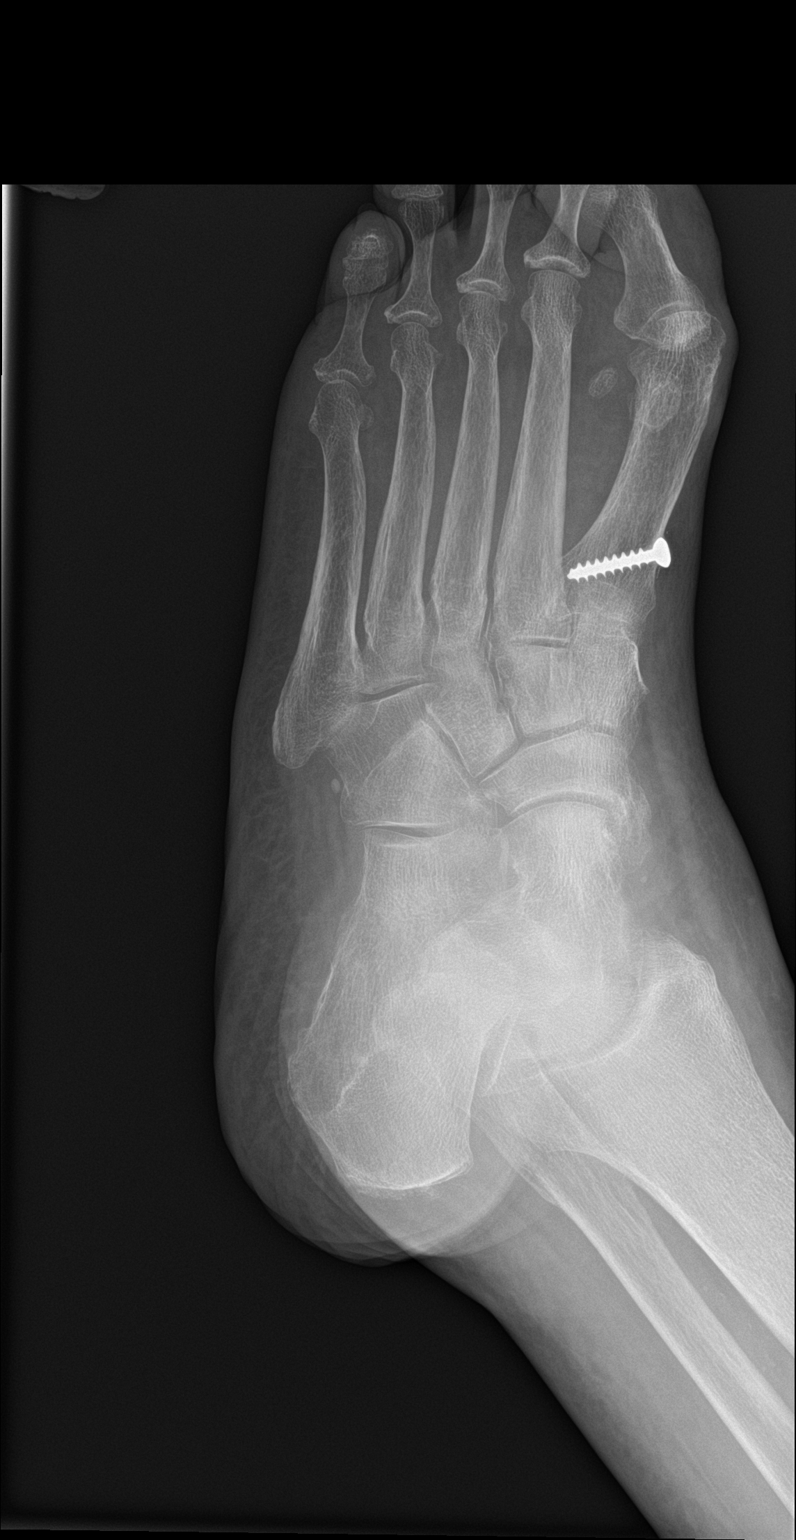

[foot lat]
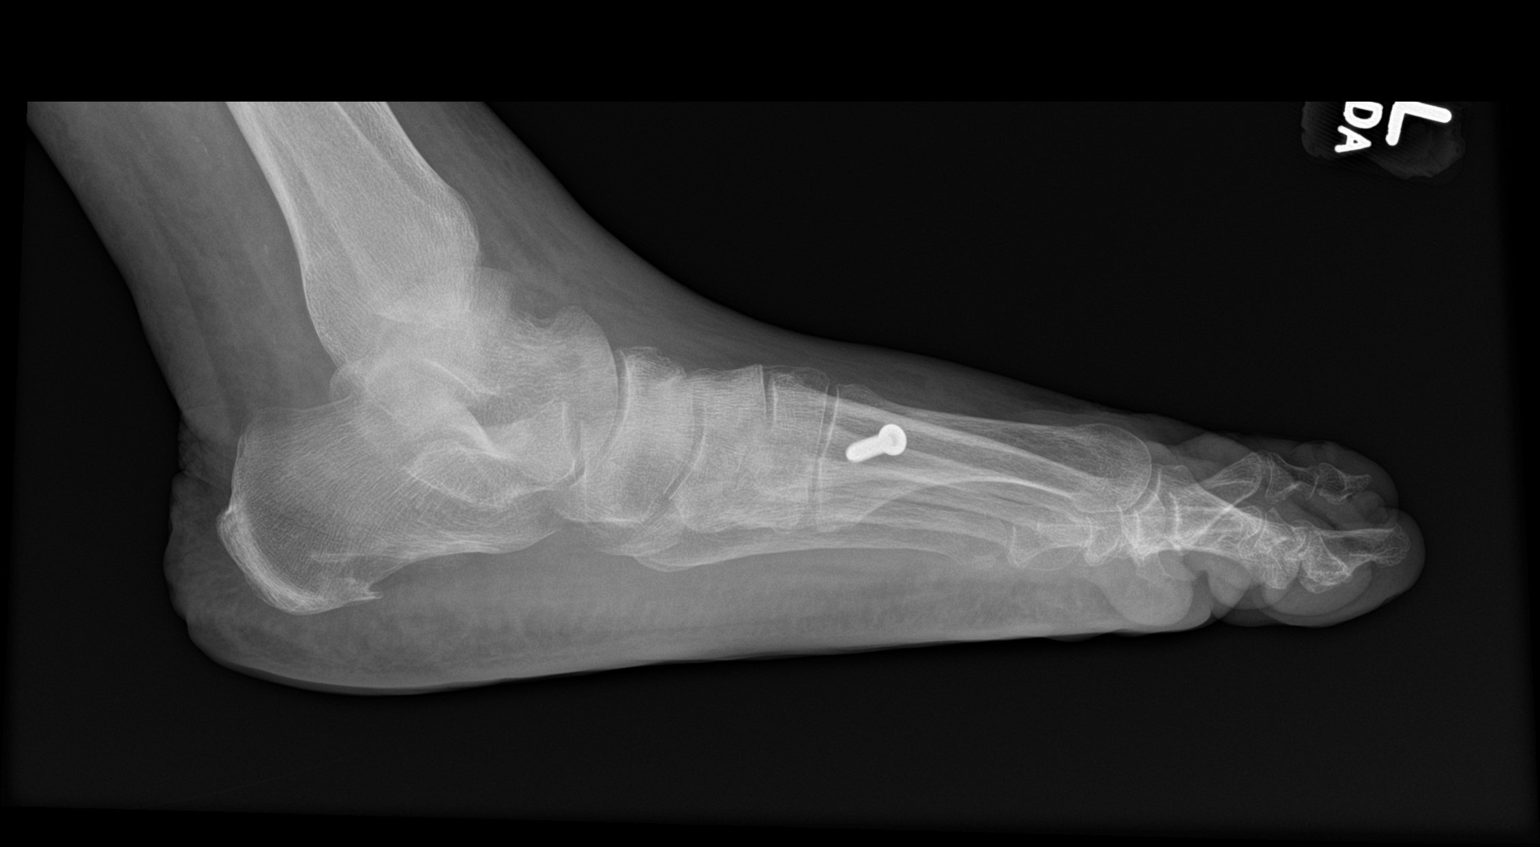

[3 of 3 positions shown; findings below may reference images not displayed]

FINDINGS: There is no acute fracture or dislocation. The bones are osteopenic.
Postsurgical changes with screw in the base of the first metatarsal.
There is mild diffuse subcutaneous edema.
IMPRESSION: 1. No acute fracture or dislocation.
2. Osteopenia with postsurgical changes of the first metatarsal.
3. Mild diffuse subcutaneous edema.
